# Patient Record
Sex: Female | Born: 1975
Health system: Southern US, Community
[De-identification: ages and names within clinical notes are randomized; demographics above are authoritative.]

## PROBLEM LIST (undated history)

## (undated) DIAGNOSIS — F419 Anxiety disorder, unspecified: Secondary | ICD-10-CM

## (undated) DIAGNOSIS — IMO0002 Reserved for concepts with insufficient information to code with codable children: Secondary | ICD-10-CM

## (undated) DIAGNOSIS — G43909 Migraine, unspecified, not intractable, without status migrainosus: Secondary | ICD-10-CM

## (undated) HISTORY — DX: Anxiety disorder, unspecified: F41.9

## (undated) HISTORY — PX: TONSILECTOMY/ADENOIDECTOMY WITH MYRINGOTOMY: SHX6125

## (undated) HISTORY — PX: BREAST REDUCTION SURGERY: SHX8

## (undated) HISTORY — DX: Reserved for concepts with insufficient information to code with codable children: IMO0002

## (undated) HISTORY — PX: MOUTH SURGERY: SHX715

## (undated) HISTORY — PX: BREAST SURGERY: SHX581

## (undated) HISTORY — PX: ABDOMINAL HYSTERECTOMY: SHX81

## (undated) HISTORY — DX: Migraine, unspecified, not intractable, without status migrainosus: G43.909

---

## 1999-08-20 ENCOUNTER — Other Ambulatory Visit: Admission: RE | Admit: 1999-08-20 | Discharge: 1999-08-20 | Payer: Self-pay | Admitting: Obstetrics and Gynecology

## 2002-01-26 ENCOUNTER — Encounter: Payer: Self-pay | Admitting: Obstetrics & Gynecology

## 2002-01-26 ENCOUNTER — Inpatient Hospital Stay (HOSPITAL_COMMUNITY): Admission: RE | Admit: 2002-01-26 | Discharge: 2002-01-28 | Payer: Self-pay | Admitting: Obstetrics & Gynecology

## 2006-12-13 ENCOUNTER — Emergency Department (HOSPITAL_COMMUNITY): Admission: EM | Admit: 2006-12-13 | Discharge: 2006-12-13 | Payer: Self-pay | Admitting: Emergency Medicine

## 2007-07-13 ENCOUNTER — Emergency Department (HOSPITAL_COMMUNITY): Admission: EM | Admit: 2007-07-13 | Discharge: 2007-07-13 | Payer: Self-pay | Admitting: Emergency Medicine

## 2007-07-19 ENCOUNTER — Emergency Department (HOSPITAL_COMMUNITY): Admission: EM | Admit: 2007-07-19 | Discharge: 2007-07-19 | Payer: Self-pay | Admitting: Emergency Medicine

## 2010-05-15 ENCOUNTER — Ambulatory Visit (HOSPITAL_COMMUNITY)
Admission: RE | Admit: 2010-05-15 | Discharge: 2010-05-15 | Disposition: A | Discharge status: Home / Self Care | Payer: PRIVATE HEALTH INSURANCE | Source: Ambulatory Visit | Attending: Internal Medicine | Admitting: Internal Medicine

## 2010-05-15 ENCOUNTER — Other Ambulatory Visit (HOSPITAL_COMMUNITY): Payer: Self-pay | Admitting: Internal Medicine

## 2010-05-15 DIAGNOSIS — R109 Unspecified abdominal pain: Secondary | ICD-10-CM | POA: Insufficient documentation

## 2010-08-21 NOTE — Op Note (Signed)
   NAME:  Tracey Mcneil, Tracey Mcneil                        ACCOUNT NO.:  [id number]   MEDICAL RECORD NO.:  [id number]                   PATIENT TYPE:  INP   LOCATION:  A426                                 FACILITY:  [facility]   PHYSICIAN:  Tilda Burrow, M.D.              DATE OF BIRTH:  [birth year]   DATE OF PROCEDURE:  03/27/2002  DATE OF DISCHARGE:  01/28/2002                                 OPERATIVE REPORT   PREOPERATIVE DIAGNOSIS:  Postoperative vaginal cuff bleeding.   POSTOPERATIVE DIAGNOSES:  1. Postoperative vaginal cuff bleeding.  2. Bladder flap hematoma.   PROCEDURE:  1. Revision of anterior repair.  2. Evacuation of anterior repair hematoma.   SURGEON:  Tilda Burrow, M.D.   ASSISTANTAmie Critchley.   ANESTHESIA:  General.   COMPLICATIONS:  None.   ESTIMATED BLOOD LOSS:  100 mL plus old clots.   DESCRIPTION OF PROCEDURE:  The patient was taken to the operating room after  consent was obtained and efforts at nonsurgical treatment of the hematoma  had failed. The patient had repeat anesthesia administered and then we  proceeded to open the anterior incision of the prior hysterectomy and  anterior repair closure.  There was a reasonable amount of clots in place,  less than 100 mL.  This was extracted and beneath it we found a small  arterial pumper, underneath the bladder flap.  This was identified and point  cautery used as necessary to achieve hemostasis and then a couple of extra  stitches with 2-0 Dexon placed to ensure adequate hemostasis.  Skin edges of  the vaginal mucosa were then reapproximated and vaginal packing positioned.  The patient went to the recovery room in stable condition.                                               Tilda Burrow, M.D.    JVF/MEDQ  D:  04/28/2002  T:  04/29/2002  Job:  [id number]

## 2010-08-21 NOTE — H&P (Signed)
NAME:  Tracey Mcneil, Tracey Mcneil                        ACCOUNT NO.:  [id number]   MEDICAL RECORD NO.:  [id number]                   PATIENT TYPE:  AMB   LOCATION:  DAY                                  FACILITY:  [facility]   PHYSICIAN:  Lazaro Arms, M.D.                DATE OF BIRTH:  [birth year]   DATE OF ADMISSION:  DATE OF DISCHARGE:                                HISTORY & PHYSICAL   HISTORY OF PRESENT ILLNESS:  The patient is a 35 year old white female,  gravida 4, para 3, abortus 1, with last menstrual period of January 10, 2002,  who presents complaining of prolonged heavy vaginal bleeding with bad  cramping with her menses.  She states that she bleeds normally about five to  seven days, but she has had worse clotting and the bleeding has been heavier  recently.  She had an IUD in place and was taking the birth control pills  for it, because she was having bleeding, but beyond that, she has a lot of  pressure symptoms, like something is falling out or something is pulling  out.  She denies any incontinence.  Intercourse is painful because it feels  like something is pushing on the cervix or the uterus.  She has no adnexal  or side pain.  On examination, she has grade 2 to 3 uterine prolapse, a  grade 2 cystocele and a grade 1 rectocele.  The adnexa are negative.  I  talked with the patient.  This problem runs in her family; she has a sister  with bad prolapse and her mother also and, in fact, her sister is  incontinent.  We discussed at this time she could delay surgery and we could  probably handle the bleeding with birth control pills, but the pain that she  is having with the pressure just with her job and everything is more than  she can handle, and she wants definitive therapy.  She is admitted for a TVH  and A&P repair.  She understands that I may not do a posterior repair,  depending on what the vagina is like after the anterior repair.  She also  understands that this probably will  not be a definitive surgery for her,  that some point in the future, she will probably need a sling __________  placed, but since she is so young, I do not want to get too aggressive at  this point, but we will do as good a suspension as we can of the vaginal  cuff.   PAST MEDICAL HISTORY:  Past medical history is significant for depression.   PAST SURGICAL HISTORY:  Breast reduction and tonsils.   PAST OBSTETRICAL HISTORY:  One miscarriage and three vaginal deliveries.   MEDICATIONS:  Wellbutrin 150 mg twice a day.   ALLERGIES:  SULFA.   REVIEW OF SYSTEMS:  Her review of systems is otherwise negative.   PHYSICAL  EXAMINATION:  VITAL SIGNS:  Her weight is 114 pounds.  Blood  pressure is 100/70.  HEENT:  Unremarkable.  NECK:  Thyroid is normal.  LUNGS:  Lungs are clear.  HEART:  Heart is regular rhythm without murmur, regurgitation or gallop.  BREASTS:  Deferred.  ABDOMEN:  Benign.  No hepatosplenomegaly or masses.  PELVIC:  She has normal external genitalia.  She has uterine prolapse, a  grade 2 cystocele and grade 1 rectocele.  The uterus is quite tender to  palpation and the adnexa are negative.  RECTAL:  Exam is benign.  EXTREMITIES:  Extremities are warm with no edema.  NEUROLOGIC:  Exam is grossly intact.   IMPRESSION:  1. Symptomatic pelvic relaxation.  2. Dysmenorrhea.  3. Menometrorrhagia.   PLAN:  The patient is admitted for [facility] and anterior colporrhaphy and  possibly posterior colporrhaphy.  She understands the rationale behind the  procedures that we will do or not do, based on my best estimation of sexual  function.  She also understands that in the future she will probably need  more aggressive therapy, but I want to try to avoid that at this time  because of her age and want to maintain good sexual function.                                               Lazaro Arms, M.D.    [name]  D:  01/24/2002  T:  01/25/2002  Job:  [id number]

## 2010-08-21 NOTE — Op Note (Signed)
NAME:  Tracey Mcneil, Tracey Mcneil                        ACCOUNT NO.:  [id number]   MEDICAL RECORD NO.:  [id number]                   PATIENT TYPE:  AMB   LOCATION:  DAY                                  FACILITY:  [facility]   PHYSICIAN:  Lazaro Arms, M.D.                DATE OF BIRTH:  10/17/1975   DATE OF PROCEDURE:  01/25/2002  DATE OF DISCHARGE:                                 OPERATIVE REPORT   PREOPERATIVE DIAGNOSES:  1. Symptomatic pelvic relaxation.  2. Dysmenorrhea.  3. Menometrorrhagia.   POSTOPERATIVE DIAGNOSES:  1. Symptomatic pelvic relaxation.  2. Dysmenorrhea.  3. Menometrorrhagia.   PROCEDURE:  A vaginal hysterectomy with anterior repair.   SURGEON:  Lazaro Arms, M.D.   ANESTHESIA:  General endotracheal.   FINDINGS:  The patient had a grade 2-3 uterine prolapse, grade 2 bladder  prolapse, and a grade 1 rectal prolapse.  Intraperitoneal structures were  normal.  Her ovaries and tubes were normal.  The lower pelvis was normal.   DESCRIPTION OF OPERATION:  The patient was taken to the operating room and  placed in the supine position where she underwent general endotracheal  anesthesia.  She was then placed in dorsal lithotomy position in candy-cane  stirrups.  The lower abdomen, inner thighs, vagina, and perianal region were  prepped with Betadine.  A weighted speculum was placed posteriorly and a  Deaver anteriorly.  The cervix was grasped with two thyroid tenaculums.  Marcaine 0.5% with 1:200,000 epinephrine was injected in a circumferential  fashion about the cervix to create a plane and also to minimize blood loss.  The electrocautery Bovie was then used to make a circumferential incision  about the cervix, and the anterior and posterior vagina were pushed off the  lower uterine segment.  The posterior cul-de-sac was entered sharply without  difficulty, and a long-bill weighted speculum was placed.  The uterosacral  ligament were clamped, cut, transfixed, and  suture ligated and held.  The  cardinal ligaments were clamped, cut, transfixed, and suture ligated and  cut.  The anterior cul-de-sac was entered sharply without difficulty.  The  anterior and posterior leaves of the broad ligament were plicated, and the  uterine vessels were clamped, cut, and suture ligated.  Serial pedicles were  taken through the fundus, each pedicle being clamped, cut, and suture  ligated.  The uteroovarian ligaments were cross-clamped; double suture  ligation was performed.  There was good hemostasis of these pedicles.  There  was some raw peritoneal surface on the right, and hemoclips were placed.  These sutures were cut free.  McCall culdoplasty suture was placed and held.  The peritoneum was closed in a pursestring fashion.  The vagina was closed  anterior to posterior in an interrupted fashion.  The McCall culdoplasty  suture was then tied down tightly for good vaginal support.  The anterior  vagina was then opened in the midline,  and the bladder was dissected off the  vagina sharply and bluntly.  A Kelly plication and anterior colporrhaphy was  then performed in the usual fashion with good hemostasis.  The extra vagina  was trimmed away, and the vagina was closed in interrupted running fashion.  At this point, I could easily get two fingers in the mid point of the vagina  crossways, but it did not feel like a posterior repair would be in her best  interest and as we had talked in the office preoperatively.  As a result,  the procedure was felt to be completed at this point.  She experienced 100  cc of blood loss.  She was awakened from anesthesia and taken to the  recovery room in good and stable condition.  All counts were correct x 3.                                               Lazaro Arms, M.D.    [name]  D:  01/25/2002  T:  01/25/2002  Job:  [id number]

## 2010-08-21 NOTE — Discharge Summary (Signed)
   NAME:  Tracey Mcneil, Tracey Mcneil                        ACCOUNT NO.:  [id number]   MEDICAL RECORD NO.:  [id number]                   PATIENT TYPE:  INP   LOCATION:  A426                                 FACILITY:  [facility]   PHYSICIAN:  Lazaro Arms, M.D.                DATE OF BIRTH:  1976-03-15   DATE OF ADMISSION:  01/25/2002  DATE OF DISCHARGE:  01/28/2002                                 DISCHARGE SUMMARY   DISCHARGE DIAGNOSES:  1. Status post total vaginal hysterectomy and anterior colporrhaphy.  2. Postoperative hemorrhage.   PROCEDURES:  1. Total vaginal hysterectomy and anterior colporrhaphy.  2. Reoperation due to vaginal bleeding that could not be controlled with     packing.  3. Vaginal packing.   HISTORY OF PRESENT ILLNESS:  Please refer to the transcribed history and  physical and operative note for details of admission to the hospital.   HOSPITAL COURSE:  The patient was admitted postoperatively.  She did quite  well.  Intraoperative blood loss was quite low.  However, several hours  postoperatively she developed heavy vaginal bleeding.  In my absence was  taken back to the operating room by Dr. Emelda Fear because he could not  control it with packing.  She was really found to have an active arterial  bleeder, and this was resutured, and the patient was packed again at this  time.  I removed the packing the next morning, and she began bleeding again.  Again, this was the anterior colporrhaphy portion of the surgery.  I  repacked her and left it for 24 hours and removed it once again, and she had  no bleeding.  She did not have any fever postoperatively.  Her hemoglobin  and hematocrit bottomed out at about 7 and 21, but she tolerated ambulation.  She tolerated clear liquids and a regular diet, and she voided without  symptoms.  She was discharged to home on the evening of postoperative day #2  in stable condition.  To follow up next week to have her hemoglobin checked.  She  was discharged on Levaquin as well.                                               Lazaro Arms, M.D.    [name]  D:  04/24/2002  T:  04/25/2002  Job:  [id number]

## 2010-12-29 LAB — URINALYSIS, ROUTINE W REFLEX MICROSCOPIC
Bilirubin Urine: NEGATIVE
Glucose, UA: 250 — AB
Nitrite: POSITIVE — AB
Protein, ur: 100 — AB
Specific Gravity, Urine: <1.005 — ABNORMAL LOW
Urobilinogen, UA: >8 — ABNORMAL HIGH
pH: 5.5

## 2010-12-29 LAB — URINE MICROSCOPIC-ADD ON

## 2011-01-15 LAB — URINE MICROSCOPIC-ADD ON

## 2011-01-15 LAB — DIFFERENTIAL
Basophils Absolute: 0
Basophils Relative: 0
Eosinophils Absolute: 0.1
Monocytes Absolute: 0.7
Monocytes Relative: 7

## 2011-01-15 LAB — URINALYSIS, ROUTINE W REFLEX MICROSCOPIC
Bilirubin Urine: NEGATIVE
Nitrite: NEGATIVE
Specific Gravity, Urine: 1.02
pH: 6

## 2011-01-15 LAB — BASIC METABOLIC PANEL
CO2: 26
Calcium: 8.8
Chloride: 100
Creatinine, Ser: 0.6
Glucose, Bld: 81

## 2011-01-15 LAB — CBC
Hemoglobin: 12.9
MCHC: 34.7
MCV: 85.6
RBC: 4.35
RDW: 11.6

## 2012-10-19 ENCOUNTER — Other Ambulatory Visit: Payer: Self-pay | Admitting: Obstetrics & Gynecology

## 2012-10-19 ENCOUNTER — Other Ambulatory Visit: Payer: Self-pay | Admitting: *Deleted

## 2012-10-19 MED ORDER — ALPRAZOLAM 1 MG PO TABS: 1.0000 mg | ORAL_TABLET | Freq: Three times a day (TID) | ORAL | Status: DC | PRN

## 2013-02-15 ENCOUNTER — Other Ambulatory Visit: Payer: Self-pay | Admitting: *Deleted

## 2013-02-15 MED ORDER — ALPRAZOLAM 1 MG PO TABS: 1.0000 mg | ORAL_TABLET | Freq: Every evening | ORAL | Status: DC | PRN

## 2013-04-16 ENCOUNTER — Other Ambulatory Visit: Payer: Self-pay | Admitting: *Deleted

## 2013-04-17 ENCOUNTER — Telehealth: Payer: Self-pay | Admitting: *Deleted

## 2013-04-17 MED ORDER — ALPRAZOLAM 1 MG PO TABS: 1.0000 mg | ORAL_TABLET | Freq: Three times a day (TID) | ORAL | Status: DC | PRN

## 2013-04-18 NOTE — Telephone Encounter (Signed)
Spoke with pharmacist at [facility]. Gave verbal order for Xanax 1 mg take 1 tab by mouth TID as needed for anxiety # 90 with 1 refill per Dr. Despina HiddenEure. Pharmacy never received Rx. JSY

## 2013-04-23 ENCOUNTER — Ambulatory Visit (INDEPENDENT_AMBULATORY_CARE_PROVIDER_SITE_OTHER): Payer: PRIVATE HEALTH INSURANCE

## 2013-04-23 ENCOUNTER — Other Ambulatory Visit: Payer: Self-pay | Admitting: Obstetrics & Gynecology

## 2013-04-23 ENCOUNTER — Encounter (INDEPENDENT_AMBULATORY_CARE_PROVIDER_SITE_OTHER): Payer: Self-pay

## 2013-04-23 ENCOUNTER — Ambulatory Visit (INDEPENDENT_AMBULATORY_CARE_PROVIDER_SITE_OTHER): Payer: PRIVATE HEALTH INSURANCE | Admitting: Obstetrics & Gynecology

## 2013-04-23 ENCOUNTER — Ambulatory Visit (HOSPITAL_COMMUNITY): Payer: Self-pay

## 2013-04-23 ENCOUNTER — Encounter: Payer: Self-pay | Admitting: Obstetrics & Gynecology

## 2013-04-23 VITALS — BP 98/60 | Ht 65.0 in | Wt 122.5 lb

## 2013-04-23 DIAGNOSIS — Z01419 Encounter for gynecological examination (general) (routine) without abnormal findings: Secondary | ICD-10-CM

## 2013-04-23 DIAGNOSIS — R160 Hepatomegaly, not elsewhere classified: Secondary | ICD-10-CM

## 2013-04-23 NOTE — Progress Notes (Signed)
Patient ID: Tracey RiegerClaudia Heenan, female   DOB: 04/19/1975, 38 y.o.   MRN: [id number] Subjective:     Tracey Mcneil is a 38 y.o. female here for a routine exam.  No LMP recorded. Patient has had a hysterectomy. No obstetric history on file. Birth Control Method:  Hysterectomy  Menstrual Calendar(currently): na  Current complaints: none.   Current acute medical issues:  none   Recent Gynecologic History No LMP recorded. Patient has had a hysterectomy. Last Pap: na ,  na Last mammogram: ,  na  Past Medical History  Diagnosis Date  . Anxiety   . Migraines   . Degenerative disk disease     Past Surgical History  Procedure Laterality Date  . Abdominal hysterectomy      bladder tach  . Breast surgery      breast implants  . Breast reduction surgery    . Tonsilectomy/adenoidectomy with myringotomy    . Mouth surgery      OB History   Grav Para Term Preterm Abortions TAB SAB Ect Mult Living                  History   Social History  . Marital Status: Married    Spouse Name: N/A    Number of Children: N/A  . Years of Education: N/A   Social History Main Topics  . Smoking status: Never Smoker   . Smokeless tobacco: Never Used  . Alcohol Use: No  . Drug Use: No  . Sexual Activity: Yes   Other Topics Concern  . None   Social History Narrative  . None    Family History  Problem Relation Age of Onset  . Thyroid disease Mother   . Diabetes Father   . Hyperlipidemia Father   . Diabetes Sister   . Heart disease Sister   . Thyroid disease Sister   . Heart disease Maternal Grandmother     CHF  . Diabetes Maternal Grandmother   . Heart disease Paternal Grandmother   . Alzheimer's disease Paternal Grandmother   . Thyroid disease Sister   . Thyroid disease Sister      Review of Systems  Review of Systems  Constitutional: Negative for fever, chills, weight loss, malaise/fatigue and diaphoresis.  HENT: Negative for hearing loss, ear pain, nosebleeds, congestion,  sore throat, neck pain, tinnitus and ear discharge.   Eyes: Negative for blurred vision, double vision, photophobia, pain, discharge and redness.  Respiratory: Negative for cough, hemoptysis, sputum production, shortness of breath, wheezing and stridor.   Cardiovascular: Negative for chest pain, palpitations, orthopnea, claudication, leg swelling and PND.  Gastrointestinal: negative for abdominal pain. Negative for heartburn, nausea, vomiting, diarrhea, constipation, blood in stool and melena.  Genitourinary: Negative for dysuria, urgency, frequency, hematuria and flank pain.  Musculoskeletal: Negative for myalgias, back pain, joint pain and falls.  Skin: Negative for itching and rash.  Neurological: Negative for dizziness, tingling, tremors, sensory change, speech change, focal weakness, seizures, loss of consciousness, weakness and headaches.  Endo/Heme/Allergies: Negative for environmental allergies and polydipsia. Does not bruise/bleed easily.  Psychiatric/Behavioral: Negative for depression, suicidal ideas, hallucinations, memory loss and substance abuse. The patient is not nervous/anxious and does not have insomnia.        Objective:    Physical Exam  Vitals reviewed. Constitutional: She is oriented to person, place, and time. She appears well-developed and well-nourished.  HENT:  Head: Normocephalic and atraumatic.        Right Ear: External ear normal.  Left  Ear: External ear normal.  Nose: Nose normal.  Mouth/Throat: Oropharynx is clear and moist.  Eyes: Conjunctivae and EOM are normal. Pupils are equal, round, and reactive to light. Right eye exhibits no discharge. Left eye exhibits no discharge. No scleral icterus.  Neck: Normal range of motion. Neck supple. No tracheal deviation present. No thyromegaly present.  Cardiovascular: Normal rate, regular rhythm, normal heart sounds and intact distal pulses.  Exam reveals no gallop and no friction rub.   No murmur  heard. Respiratory: Effort normal and breath sounds normal. No respiratory distress. She has no wheezes. She has no rales. She exhibits no tenderness.  GI: Soft. Bowel sounds are normal. She exhibits no distension and no mass. There is no tenderness. There is no rebound and no guarding.  Genitourinary:  Breasts no masses skin changes or nipple changes bilaterally      Vulva is normal without lesions Vagina is pink moist without discharge Cervix absent Uterus absent Adnexa is negative with normal sized ovaries    Musculoskeletal: Normal range of motion. She exhibits no edema and no tenderness.  Neurological: She is alert and oriented to person, place, and time. She has normal reflexes. She displays normal reflexes. No cranial nerve deficit. She exhibits normal muscle tone. Coordination normal.  Skin: Skin is warm and dry. No rash noted. No erythema. No pallor.  Psychiatric: She has a normal mood and affect. Her behavior is normal. Judgment and thought content normal.       Assessment:    Healthy female exam.    Plan:    Follow up in: 1 year.

## 2013-06-11 ENCOUNTER — Other Ambulatory Visit: Payer: Self-pay | Admitting: *Deleted

## 2013-06-11 MED ORDER — ALPRAZOLAM 1 MG PO TABS: 1.0000 mg | ORAL_TABLET | Freq: Three times a day (TID) | ORAL | Status: DC | PRN

## 2013-06-13 ENCOUNTER — Telehealth: Payer: Self-pay | Admitting: Obstetrics & Gynecology

## 2013-06-13 NOTE — Telephone Encounter (Signed)
Pt requesting a 90 day supply of Xanax verses 30 day supply with 5 refills. Fax given to Dr. Despina HiddenEure to clarify order.

## 2013-06-14 ENCOUNTER — Telehealth: Payer: Self-pay | Admitting: Obstetrics & Gynecology

## 2013-06-14 MED ORDER — ALPRAZOLAM 1 MG PO TABS: 1.0000 mg | ORAL_TABLET | Freq: Three times a day (TID) | ORAL | Status: DC | PRN

## 2013-06-14 NOTE — Telephone Encounter (Signed)
Spoke with pt letting her know new Rx was sent to pharmacy for Xanax. JSY

## 2013-08-16 ENCOUNTER — Other Ambulatory Visit: Payer: Self-pay | Admitting: Obstetrics & Gynecology

## 2013-10-15 ENCOUNTER — Other Ambulatory Visit: Payer: Self-pay | Admitting: Obstetrics & Gynecology

## 2013-12-13 ENCOUNTER — Other Ambulatory Visit: Payer: Self-pay | Admitting: Obstetrics & Gynecology

## 2014-02-08 ENCOUNTER — Other Ambulatory Visit: Payer: Self-pay | Admitting: Obstetrics & Gynecology

## 2014-05-17 ENCOUNTER — Ambulatory Visit (INDEPENDENT_AMBULATORY_CARE_PROVIDER_SITE_OTHER): Payer: BLUE CROSS/BLUE SHIELD | Admitting: Obstetrics & Gynecology

## 2014-05-17 ENCOUNTER — Encounter: Payer: Self-pay | Admitting: Obstetrics & Gynecology

## 2014-05-17 VITALS — BP 120/80 | Ht 65.0 in | Wt 123.0 lb

## 2014-05-17 DIAGNOSIS — Z01411 Encounter for gynecological examination (general) (routine) with abnormal findings: Secondary | ICD-10-CM

## 2014-05-17 DIAGNOSIS — Z01419 Encounter for gynecological examination (general) (routine) without abnormal findings: Secondary | ICD-10-CM

## 2014-05-17 MED ORDER — TERCONAZOLE 0.4 % VA CREA: 1.0000 | TOPICAL_CREAM | Freq: Every day | VAGINAL | Status: DC

## 2014-05-17 NOTE — Addendum Note (Signed)
Addended by: Lazaro ArmsEURE, LUTHER H on: 05/17/2014 09:46 AM   Modules accepted: Orders, Medications

## 2014-05-17 NOTE — Progress Notes (Signed)
Patient ID: Tracey RiegerClaudia Geter, female   DOB: 07/20/1975, 39 y.o.   MRN: [id number] Subjective:     Tracey Mcneil is a 39 y.o. female here for a routine exam.  No LMP recorded. Patient has had a hysterectomy. No obstetric history on file. Birth Control Method:  na Menstrual Calendar(currently): na  Current complaints: none.   Current acute medical issues:  none   Recent Gynecologic History No LMP recorded. Patient has had a hysterectomy. Last Pap: years ago,  normal Last mammogram: ,    Past Medical History  Diagnosis Date  . Anxiety   . Migraines   . Degenerative disk disease     Past Surgical History  Procedure Laterality Date  . Abdominal hysterectomy      bladder tach  . Breast surgery      breast implants  . Breast reduction surgery    . Tonsilectomy/adenoidectomy with myringotomy    . Mouth surgery      OB History    No data available      History   Social History  . Marital Status: Married    Spouse Name: N/A  . Number of Children: N/A  . Years of Education: N/A   Social History Main Topics  . Smoking status: Never Smoker   . Smokeless tobacco: Never Used  . Alcohol Use: No  . Drug Use: No  . Sexual Activity: Yes   Other Topics Concern  . None   Social History Narrative    Family History  Problem Relation Age of Onset  . Thyroid disease Mother   . Diabetes Father   . Hyperlipidemia Father   . Diabetes Sister   . Heart disease Sister   . Thyroid disease Sister   . Heart disease Maternal Grandmother     CHF  . Diabetes Maternal Grandmother   . Heart disease Paternal Grandmother   . Alzheimer's disease Paternal Grandmother   . Thyroid disease Sister   . Thyroid disease Sister      Current outpatient prescriptions:  .  ALPRAZolam (XANAX) 1 MG tablet, Take 1 tablet (1 mg total) by mouth 3 (three) times daily as needed for anxiety., Disp: 90 tablet, Rfl: 1 .  topiramate (TOPAMAX) 50 MG tablet, Take 50 mg by mouth 2 (two) times daily. , Disp:  , Rfl:  .  ALPRAZolam (XANAX) 1 MG tablet, Take 1 tablet (1 mg total) by mouth 3 (three) times daily as needed for anxiety. (Patient not taking: Reported on 05/17/2014), Disp: 90 tablet, Rfl: 3 .  ALPRAZolam (XANAX) 1 MG tablet, TAKE ONE TABLET BY MOUTH THREE TIMES A DAY AS NEEDED FOR ANXIETY (Patient not taking: Reported on 05/17/2014), Disp: 90 tablet, Rfl: 1 .  ALPRAZolam (XANAX) 1 MG tablet, TAKE ONE TABLET BY MOUTH THREE TIMES A DAY AS NEEDED FOR ANXIETY (Patient not taking: Reported on 05/17/2014), Disp: 90 tablet, Rfl: 1 .  ALPRAZolam (XANAX) 1 MG tablet, TAKE 1 TABLET BY MOUTH THREE TIMES DAILY AS NEEDED FOR ANXIETY (Patient not taking: Reported on 05/17/2014), Disp: 90 tablet, Rfl: 3  Review of Systems  Review of Systems  Constitutional: Negative for fever, chills, weight loss, malaise/fatigue and diaphoresis.  HENT: Negative for hearing loss, ear pain, nosebleeds, congestion, sore throat, neck pain, tinnitus and ear discharge.   Eyes: Negative for blurred vision, double vision, photophobia, pain, discharge and redness.  Respiratory: Negative for cough, hemoptysis, sputum production, shortness of breath, wheezing and stridor.   Cardiovascular: Negative for chest pain, palpitations, orthopnea, claudication,  leg swelling and PND.  Gastrointestinal: negative for abdominal pain. Negative for heartburn, nausea, vomiting, diarrhea, constipation, blood in stool and melena.  Genitourinary: Negative for dysuria, urgency, frequency, hematuria and flank pain.  Musculoskeletal: Negative for myalgias, back pain, joint pain and falls.  Skin: Negative for itching and rash.  Neurological: Negative for dizziness, tingling, tremors, sensory change, speech change, focal weakness, seizures, loss of consciousness, weakness and headaches.  Endo/Heme/Allergies: Negative for environmental allergies and polydipsia. Does not bruise/bleed easily.  Psychiatric/Behavioral: Negative for depression, suicidal ideas,  hallucinations, memory loss and substance abuse. The patient is not nervous/anxious and does not have insomnia.        Objective:  Blood pressure 120/80, height  (1.651 m), weight 123 lb (55.792 kg).   Physical Exam  Vitals reviewed. Constitutional: She is oriented to person, place, and time. She appears well-developed and well-nourished.  HENT:  Head: Normocephalic and atraumatic.        Right Ear: External ear normal.  Left Ear: External ear normal.  Nose: Nose normal.  Mouth/Throat: Oropharynx is clear and moist.  Eyes: Conjunctivae and EOM are normal. Pupils are equal, round, and reactive to light. Right eye exhibits no discharge. Left eye exhibits no discharge. No scleral icterus.  Neck: Normal range of motion. Neck supple. No tracheal deviation present. No thyromegaly present.  Cardiovascular: Normal rate, regular rhythm, normal heart sounds and intact distal pulses.  Exam reveals no gallop and no friction rub.   No murmur heard. Respiratory: Effort normal and breath sounds normal. No respiratory distress. She has no wheezes. She has no rales. She exhibits no tenderness.  GI: Soft. Bowel sounds are normal. She exhibits no distension and no mass. There is no tenderness. There is no rebound and no guarding.  Genitourinary:  Breasts no masses skin changes or nipple changes bilaterally      Vulva is normal without lesions Vagina is pink moist with yeast evident Cervix absent Uterus is normal size shape and contour Adnexa is negative with normal sized ovaries   Musculoskeletal: Normal range of motion. She exhibits no edema and no tenderness.  Neurological: She is alert and oriented to person, place, and time. She has normal reflexes. She displays normal reflexes. No cranial nerve deficit. She exhibits normal muscle tone. Coordination normal.  Skin: Skin is warm and dry. No rash noted. No erythema. No pallor.  Psychiatric: She has a normal mood and affect. Her behavior is normal.  Judgment and thought content normal.       Assessment:    Healthy female exam.    Plan:    Mammogram ordered. Follow up in: 1 year. Terazol 7

## 2014-05-30 ENCOUNTER — Other Ambulatory Visit: Payer: Self-pay

## 2014-05-30 DIAGNOSIS — Z1231 Encounter for screening mammogram for malignant neoplasm of breast: Secondary | ICD-10-CM

## 2014-06-03 ENCOUNTER — Telehealth: Payer: Self-pay | Admitting: *Deleted

## 2014-06-03 ENCOUNTER — Other Ambulatory Visit: Payer: Self-pay | Admitting: Obstetrics & Gynecology

## 2014-06-03 NOTE — Telephone Encounter (Signed)
Pt requesting refill on the Xanax 1 mg.

## 2014-06-04 NOTE — Telephone Encounter (Signed)
done

## 2014-06-14 ENCOUNTER — Ambulatory Visit
Admission: RE | Admit: 2014-06-14 | Discharge: 2014-06-14 | Disposition: A | Discharge status: Home / Self Care | Payer: BLUE CROSS/BLUE SHIELD | Source: Ambulatory Visit

## 2014-06-14 DIAGNOSIS — Z1231 Encounter for screening mammogram for malignant neoplasm of breast: Secondary | ICD-10-CM

## 2014-06-17 ENCOUNTER — Other Ambulatory Visit: Payer: Self-pay | Admitting: Obstetrics & Gynecology

## 2014-06-17 DIAGNOSIS — R928 Other abnormal and inconclusive findings on diagnostic imaging of breast: Secondary | ICD-10-CM

## 2014-06-27 ENCOUNTER — Ambulatory Visit
Admission: RE | Admit: 2014-06-27 | Discharge: 2014-06-27 | Disposition: A | Discharge status: Home / Self Care | Payer: BLUE CROSS/BLUE SHIELD | Source: Ambulatory Visit | Attending: Obstetrics & Gynecology | Admitting: Obstetrics & Gynecology

## 2014-06-27 DIAGNOSIS — R928 Other abnormal and inconclusive findings on diagnostic imaging of breast: Secondary | ICD-10-CM

## 2014-08-02 ENCOUNTER — Other Ambulatory Visit (HOSPITAL_COMMUNITY): Payer: Self-pay | Admitting: Physician Assistant

## 2014-08-02 ENCOUNTER — Ambulatory Visit (HOSPITAL_COMMUNITY)
Admission: RE | Admit: 2014-08-02 | Discharge: 2014-08-02 | Disposition: A | Discharge status: Home / Self Care | Payer: BLUE CROSS/BLUE SHIELD | Source: Ambulatory Visit | Attending: Physician Assistant | Admitting: Physician Assistant

## 2014-08-02 DIAGNOSIS — M25532 Pain in left wrist: Secondary | ICD-10-CM | POA: Diagnosis present

## 2014-08-02 DIAGNOSIS — Y939 Activity, unspecified: Secondary | ICD-10-CM | POA: Diagnosis not present

## 2014-08-02 DIAGNOSIS — Y929 Unspecified place or not applicable: Secondary | ICD-10-CM | POA: Diagnosis not present

## 2014-08-02 DIAGNOSIS — S6992XA Unspecified injury of left wrist, hand and finger(s), initial encounter: Secondary | ICD-10-CM | POA: Diagnosis not present

## 2014-08-02 DIAGNOSIS — G894 Chronic pain syndrome: Secondary | ICD-10-CM

## 2014-11-19 ENCOUNTER — Other Ambulatory Visit: Payer: Self-pay | Admitting: Obstetrics & Gynecology

## 2014-11-21 ENCOUNTER — Telehealth: Payer: Self-pay | Admitting: *Deleted

## 2014-11-21 NOTE — Telephone Encounter (Signed)
Xanax faxed to Pharmacy.

## 2015-05-09 ENCOUNTER — Other Ambulatory Visit: Payer: Self-pay | Admitting: Obstetrics & Gynecology

## 2015-05-12 ENCOUNTER — Telehealth: Payer: Self-pay | Admitting: Obstetrics & Gynecology

## 2015-05-12 NOTE — Telephone Encounter (Signed)
Xanax RX completed and faxed to pharmacy

## 2015-05-30 ENCOUNTER — Other Ambulatory Visit: Payer: BLUE CROSS/BLUE SHIELD | Admitting: Obstetrics & Gynecology

## 2015-06-06 ENCOUNTER — Ambulatory Visit (INDEPENDENT_AMBULATORY_CARE_PROVIDER_SITE_OTHER): Payer: Self-pay | Admitting: Obstetrics & Gynecology

## 2015-06-06 ENCOUNTER — Encounter: Payer: Self-pay | Admitting: Obstetrics & Gynecology

## 2015-06-06 VITALS — BP 100/60 | HR 74 | Ht 65.0 in | Wt 144.0 lb

## 2015-06-06 DIAGNOSIS — Z01419 Encounter for gynecological examination (general) (routine) without abnormal findings: Secondary | ICD-10-CM

## 2015-06-06 NOTE — Progress Notes (Signed)
Patient ID: Tracey Mcneil, female   DOB: 1975-06-06, 40 y.o.   MRN: [id number] Subjective:     Tracey Mcneil is a 40 y.o. female here for a routine exam.  No LMP recorded. Patient has had a hysterectomy. No obstetric history on file. Birth Control Method:  hysterectomy Menstrual Calendar(currently): amenorrheic  Current complaints: increasing nocturia 1-2 times nightly.   Current acute medical issues:  none   Recent Gynecologic History No LMP recorded. Patient has had a hysterectomy. Last Pap: na,   Last mammogram: 2016,  normal  Past Medical History  Diagnosis Date  . Anxiety   . Migraines   . Degenerative disk disease     Past Surgical History  Procedure Laterality Date  . Abdominal hysterectomy      bladder tach  . Breast surgery      breast implants  . Breast reduction surgery    . Tonsilectomy/adenoidectomy with myringotomy    . Mouth surgery      OB History    No data available      Social History   Social History  . Marital Status: Married    Spouse Name: N/A  . Number of Children: N/A  . Years of Education: N/A   Social History Main Topics  . Smoking status: Never Smoker   . Smokeless tobacco: Never Used  . Alcohol Use: No  . Drug Use: No  . Sexual Activity: Yes   Other Topics Concern  . None   Social History Narrative    Family History  Problem Relation Age of Onset  . Thyroid disease Mother   . Diabetes Father   . Hyperlipidemia Father   . Diabetes Sister   . Heart disease Sister   . Thyroid disease Sister   . Heart disease Maternal Grandmother     CHF  . Diabetes Maternal Grandmother   . Heart disease Paternal Grandmother   . Alzheimer's disease Paternal Grandmother   . Thyroid disease Sister   . Thyroid disease Sister      Current outpatient prescriptions:  .  ALPRAZolam (XANAX) 1 MG tablet, TAKE 1 TABLET THREE TIMES A DAY AS NEEDED, Disp: 90 tablet, Rfl: 5  Review of Systems  Review of Systems  Constitutional: Negative  for fever, chills, weight loss, malaise/fatigue and diaphoresis.  HENT: Negative for hearing loss, ear pain, nosebleeds, congestion, sore throat, neck pain, tinnitus and ear discharge.   Eyes: Negative for blurred vision, double vision, photophobia, pain, discharge and redness.  Respiratory: Negative for cough, hemoptysis, sputum production, shortness of breath, wheezing and stridor.   Cardiovascular: Negative for chest pain, palpitations, orthopnea, claudication, leg swelling and PND.  Gastrointestinal: negative for abdominal pain. Negative for heartburn, nausea, vomiting, diarrhea, constipation, blood in stool and melena.  Genitourinary: Negative for dysuria, urgency, frequency, hematuria and flank pain.  Musculoskeletal: Negative for myalgias, back pain, joint pain and falls.  Skin: Negative for itching and rash.  Neurological: Negative for dizziness, tingling, tremors, sensory change, speech change, focal weakness, seizures, loss of consciousness, weakness and headaches.  Endo/Heme/Allergies: Negative for environmental allergies and polydipsia. Does not bruise/bleed easily.  Psychiatric/Behavioral: Negative for depression, suicidal ideas, hallucinations, memory loss and substance abuse. The patient is not nervous/anxious and does not have insomnia.        Objective:  Blood pressure 100/60, pulse 74, height  (1.651 m), weight 144 lb (65.318 kg).   Physical Exam  Vitals reviewed. Constitutional: She is oriented to person, place, and time. She appears well-developed and  well-nourished.  HENT:  Head: Normocephalic and atraumatic.        Right Ear: External ear normal.  Left Ear: External ear normal.  Nose: Nose normal.  Mouth/Throat: Oropharynx is clear and moist.  Eyes: Conjunctivae and EOM are normal. Pupils are equal, round, and reactive to light. Right eye exhibits no discharge. Left eye exhibits no discharge. No scleral icterus.  Neck: Normal range of motion. Neck supple. No  tracheal deviation present. No thyromegaly present.  Cardiovascular: Normal rate, regular rhythm, normal heart sounds and intact distal pulses.  Exam reveals no gallop and no friction rub.   No murmur heard. Respiratory: Effort normal and breath sounds normal. No respiratory distress. She has no wheezes. She has no rales. She exhibits no tenderness.  GI: Soft. Bowel sounds are normal. She exhibits no distension and no mass. There is no tenderness. There is no rebound and no guarding.  Genitourinary:  Breasts no masses skin changes or nipple changes bilaterally      Vulva is normal without lesions Vagina is pink moist without discharge Cervix absent Uterus is absent Adnexa is negative with normal sized ovaries   Musculoskeletal: Normal range of motion. She exhibits no edema and no tenderness.  Neurological: She is alert and oriented to person, place, and time. She has normal reflexes. She displays normal reflexes. No cranial nerve deficit. She exhibits normal muscle tone. Coordination normal.  Skin: Skin is warm and dry. No rash noted. No erythema. No pallor.  Psychiatric: She has a normal mood and affect. Her behavior is normal. Judgment and thought content normal.       Assessment:    Healthy female exam.    Plan:    Follow up in: 1 year. refilled xanax recently    No orders of the defined types were placed in this encounter.    No orders of the defined types were placed in this encounter.

## 2015-10-27 ENCOUNTER — Other Ambulatory Visit: Payer: Self-pay | Admitting: Obstetrics & Gynecology

## 2016-02-17 ENCOUNTER — Other Ambulatory Visit: Payer: Self-pay | Admitting: Obstetrics & Gynecology

## 2016-02-19 ENCOUNTER — Other Ambulatory Visit: Payer: Self-pay | Admitting: Obstetrics & Gynecology

## 2016-04-15 ENCOUNTER — Other Ambulatory Visit: Payer: Self-pay | Admitting: Obstetrics & Gynecology

## 2016-06-11 ENCOUNTER — Other Ambulatory Visit: Payer: Self-pay | Admitting: Obstetrics & Gynecology

## 2016-06-25 ENCOUNTER — Encounter: Payer: Self-pay | Admitting: Obstetrics & Gynecology

## 2016-06-25 ENCOUNTER — Ambulatory Visit (INDEPENDENT_AMBULATORY_CARE_PROVIDER_SITE_OTHER): Payer: Self-pay | Admitting: Obstetrics & Gynecology

## 2016-06-25 VITALS — BP 118/72 | HR 97 | Ht 64.0 in | Wt 140.0 lb

## 2016-06-25 DIAGNOSIS — Z01419 Encounter for gynecological examination (general) (routine) without abnormal findings: Secondary | ICD-10-CM

## 2016-06-25 MED ORDER — CLINDAMYCIN PHOSPHATE 1 % EX GEL: Freq: Two times a day (BID) | CUTANEOUS | 11 refills | Status: DC

## 2016-06-25 NOTE — Progress Notes (Signed)
Subjective:     Tracey Mcneil is a 41 y.o. female here for a routine exam.  No LMP recorded. Patient has had a hysterectomy. G3P3 Birth Control Method:  Hysterectomy Menstrual Calendar(currently): amenorrheic  Current complaints: none.   Current acute medical issues:  A few episodes of hot flushes   Recent Gynecologic History No LMP recorded. Patient has had a hysterectomy. Last Pap: ,   Last mammogram: 2017,  normal  Past Medical History:  Diagnosis Date  . Anxiety   . Degenerative disk disease   . Migraines     Past Surgical History:  Procedure Laterality Date  . ABDOMINAL HYSTERECTOMY     bladder tach  . BREAST REDUCTION SURGERY    . BREAST SURGERY     breast implants  . MOUTH SURGERY    . TONSILECTOMY/ADENOIDECTOMY WITH MYRINGOTOMY      OB History    Gravida Para Term Preterm AB Living   3         3   SAB TAB Ectopic Multiple Live Births                  Social History   Social History  . Marital status: Married    Spouse name: N/A  . Number of children: N/A  . Years of education: N/A   Social History Main Topics  . Smoking status: Never Smoker  . Smokeless tobacco: Never Used  . Alcohol use No  . Drug use: No  . Sexual activity: Yes    Birth control/ protection: Surgical   Other Topics Concern  . None   Social History Narrative  . None    Family History  Problem Relation Age of Onset  . Thyroid disease Mother   . Diabetes Father   . Hyperlipidemia Father   . ALS Father   . Diabetes Sister   . Heart disease Sister   . Thyroid disease Sister   . Heart disease Maternal Grandmother     CHF  . Diabetes Maternal Grandmother   . Heart disease Paternal Grandmother   . Alzheimer's disease Paternal Grandmother   . Heart failure Paternal Grandmother   . Thyroid disease Sister   . Thyroid disease Sister      Current Outpatient Prescriptions:  .  ALPRAZolam (XANAX) 1 MG tablet, TAKE 1 TABLET BY MOUTH 3 TIMES A DAY AS NEEDED, Disp: 90  tablet, Rfl: 3 .  clindamycin (CLINDAGEL) 1 % gel, Apply topically 2 (two) times daily., Disp: 30 g, Rfl: 11  Review of Systems  Review of Systems  Constitutional: Negative for fever, chills, weight loss, malaise/fatigue and diaphoresis.  HENT: Negative for hearing loss, ear pain, nosebleeds, congestion, sore throat, neck pain, tinnitus and ear discharge.   Eyes: Negative for blurred vision, double vision, photophobia, pain, discharge and redness.  Respiratory: Negative for cough, hemoptysis, sputum production, shortness of breath, wheezing and stridor.   Cardiovascular: Negative for chest pain, palpitations, orthopnea, claudication, leg swelling and PND.  Gastrointestinal: negative for abdominal pain. Negative for heartburn, nausea, vomiting, diarrhea, constipation, blood in stool and melena.  Genitourinary: Negative for dysuria, urgency, frequency, hematuria and flank pain.  Musculoskeletal: Negative for myalgias, back pain, joint pain and falls.  Skin: Negative for itching and rash.  Neurological: Negative for dizziness, tingling, tremors, sensory change, speech change, focal weakness, seizures, loss of consciousness, weakness and headaches.  Endo/Heme/Allergies: Negative for environmental allergies and polydipsia. Does not bruise/bleed easily.  Psychiatric/Behavioral: Negative for depression, suicidal ideas, hallucinations, memory loss and  substance abuse. The patient is not nervous/anxious and does not have insomnia.        Objective:  Blood pressure 118/72, pulse 97, height 5\' 4"  (1.626 m), weight 140 lb (63.5 kg).   Physical Exam  Vitals reviewed. Constitutional: She is oriented to person, place, and time. She appears well-developed and well-nourished.  HENT:  Head: Normocephalic and atraumatic.        Right Ear: External ear normal.  Left Ear: External ear normal.  Nose: Nose normal.  Mouth/Throat: Oropharynx is clear and moist.  Eyes: Conjunctivae and EOM are normal. Pupils  are equal, round, and reactive to light. Right eye exhibits no discharge. Left eye exhibits no discharge. No scleral icterus.  Neck: Normal range of motion. Neck supple. No tracheal deviation present. No thyromegaly present.  Cardiovascular: Normal rate, regular rhythm, normal heart sounds and intact distal pulses.  Exam reveals no gallop and no friction rub.   No murmur heard. Respiratory: Effort normal and breath sounds normal. No respiratory distress. She has no wheezes. She has no rales. She exhibits no tenderness.  GI: Soft. Bowel sounds are normal. She exhibits no distension and no mass. There is no tenderness. There is no rebound and no guarding.  Genitourinary:  Breasts no masses skin changes or nipple changes bilaterally      Vulva is normal without lesions Vagina is pink moist without discharge Cervix absent Uterus is absent Adnexa is negative with normal sized ovaries   Musculoskeletal: Normal range of motion. She exhibits no edema and no tenderness.  Neurological: She is alert and oriented to person, place, and time. She has normal reflexes. She displays normal reflexes. No cranial nerve deficit. She exhibits normal muscle tone. Coordination normal.  Skin: Skin is warm and dry. No rash noted. No erythema. No pallor.  Psychiatric: She has a normal mood and affect. Her behavior is normal. Judgment and thought content normal.       Medications Ordered at today's visit: Meds ordered this encounter  Medications  . clindamycin (CLINDAGEL) 1 % gel    Sig: Apply topically 2 (two) times daily.    Dispense:  30 g    Refill:  11    Other orders placed at today's visit: No orders of the defined types were placed in this encounter.     Assessment:    Healthy female exam.   s/p TVH anterior repair 15 years ago Plan:    Mammogram ordered. Follow up in: 1 year.     Return in about 1 year (around 06/25/2017) for yearly, with Dr Despina HiddenEure.

## 2016-08-04 ENCOUNTER — Other Ambulatory Visit: Payer: Self-pay | Admitting: Obstetrics & Gynecology

## 2016-08-06 ENCOUNTER — Other Ambulatory Visit: Payer: Self-pay | Admitting: Obstetrics & Gynecology

## 2016-08-06 MED ORDER — ALPRAZOLAM 1 MG PO TABS: 1.0000 mg | ORAL_TABLET | Freq: Three times a day (TID) | ORAL | 3 refills | Status: DC | PRN

## 2016-08-06 NOTE — Telephone Encounter (Signed)
LMOVM that prescription was sent to pharmacy.  

## 2016-08-06 NOTE — Telephone Encounter (Signed)
Pt called and left a message on our answering machine that she has called her pharmacy and asked for a medication refill and pt would like to know if Dr. Despina HiddenEure has already refaxed the order. Please contact pt

## 2016-11-27 ENCOUNTER — Other Ambulatory Visit: Payer: Self-pay | Admitting: Obstetrics & Gynecology

## 2017-04-18 ENCOUNTER — Other Ambulatory Visit: Payer: Self-pay | Admitting: Obstetrics & Gynecology

## 2017-08-10 ENCOUNTER — Other Ambulatory Visit: Payer: Self-pay | Admitting: Obstetrics & Gynecology

## 2017-08-23 ENCOUNTER — Other Ambulatory Visit: Payer: Self-pay | Admitting: Obstetrics & Gynecology

## 2017-09-19 ENCOUNTER — Other Ambulatory Visit: Payer: Self-pay

## 2017-09-19 ENCOUNTER — Ambulatory Visit (INDEPENDENT_AMBULATORY_CARE_PROVIDER_SITE_OTHER): Payer: Self-pay | Admitting: Obstetrics & Gynecology

## 2017-09-19 ENCOUNTER — Encounter: Payer: Self-pay | Admitting: Obstetrics & Gynecology

## 2017-09-19 VITALS — BP 134/70 | HR 83 | Ht 66.0 in | Wt 184.0 lb

## 2017-09-19 DIAGNOSIS — Z01411 Encounter for gynecological examination (general) (routine) with abnormal findings: Secondary | ICD-10-CM

## 2017-09-19 DIAGNOSIS — Z79890 Hormone replacement therapy (postmenopausal): Secondary | ICD-10-CM

## 2017-09-19 DIAGNOSIS — Z01419 Encounter for gynecological examination (general) (routine) without abnormal findings: Secondary | ICD-10-CM

## 2017-09-19 MED ORDER — ESTRADIOL 1 MG PO TABS: 1.0000 mg | ORAL_TABLET | Freq: Every day | ORAL | 11 refills | Status: DC

## 2017-09-19 NOTE — Progress Notes (Signed)
Subjective:     Tracey Mcneil is a 42 y.o. female here for a routine exam.  No LMP recorded. Patient has had a hysterectomy. G3P0 Birth Control Method:  hysterectomy Menstrual Calendar(currently): amenorrheic  Current complaints: daily hot flushes.   Current acute medical issues:  none   Recent Gynecologic History No LMP recorded. Patient has had a hysterectomy. Last Pap: n/a   Last mammogram: 2016,  normal  Past Medical History:  Diagnosis Date  . Anxiety   . Degenerative disk disease   . Migraines     Past Surgical History:  Procedure Laterality Date  . ABDOMINAL HYSTERECTOMY     bladder tach  . BREAST REDUCTION SURGERY    . BREAST SURGERY     breast implants  . MOUTH SURGERY    . TONSILECTOMY/ADENOIDECTOMY WITH MYRINGOTOMY      OB History    Gravida  3   Para      Term      Preterm      AB      Living  3     SAB      TAB      Ectopic      Multiple      Live Births              Social History   Socioeconomic History  . Marital status: Married    Spouse name: Not on file  . Number of children: Not on file  . Years of education: Not on file  . Highest education level: Not on file  Occupational History  . Not on file  Social Needs  . Financial resource strain: Not on file  . Food insecurity:    Worry: Not on file    Inability: Not on file  . Transportation needs:    Medical: Not on file    Non-medical: Not on file  Tobacco Use  . Smoking status: Never Smoker  . Smokeless tobacco: Never Used  Substance and Sexual Activity  . Alcohol use: No  . Drug use: No  . Sexual activity: Yes    Birth control/protection: Surgical  Lifestyle  . Physical activity:    Days per week: Not on file    Minutes per session: Not on file  . Stress: Not on file  Relationships  . Social connections:    Talks on phone: Not on file    Gets together: Not on file    Attends religious service: Not on file    Active member of club or organization: Not  on file    Attends meetings of clubs or organizations: Not on file    Relationship status: Not on file  Other Topics Concern  . Not on file  Social History Narrative  . Not on file    Family History  Problem Relation Age of Onset  . Thyroid disease Mother   . Diabetes Father   . Hyperlipidemia Father   . ALS Father   . Diabetes Sister   . Heart disease Sister   . Thyroid disease Sister   . Heart disease Maternal Grandmother        CHF  . Diabetes Maternal Grandmother   . Heart disease Paternal Grandmother   . Alzheimer's disease Paternal Grandmother   . Heart failure Paternal Grandmother   . Thyroid disease Sister   . Thyroid disease Sister      Current Outpatient Medications:  .  ALPRAZolam (XANAX) 1 MG tablet, TAKE 1 TABLET BY MOUTH  THREE TIMES A DAY AS NEEDED, Disp: 90 tablet, Rfl: 5 .  estradiol (ESTRACE) 1 MG tablet, Take 1 tablet (1 mg total) by mouth daily., Disp: 30 tablet, Rfl: 11  Review of Systems  Review of Systems  Constitutional: Negative for fever, chills, weight loss, malaise/fatigue and diaphoresis.  HENT: Negative for hearing loss, ear pain, nosebleeds, congestion, sore throat, neck pain, tinnitus and ear discharge.   Eyes: Negative for blurred vision, double vision, photophobia, pain, discharge and redness.  Respiratory: Negative for cough, hemoptysis, sputum production, shortness of breath, wheezing and stridor.   Cardiovascular: Negative for chest pain, palpitations, orthopnea, claudication, leg swelling and PND.  Gastrointestinal: negative for abdominal pain. Negative for heartburn, nausea, vomiting, diarrhea, constipation, blood in stool and melena.  Genitourinary: Negative for dysuria, urgency, frequency, hematuria and flank pain.  Musculoskeletal: Negative for myalgias, back pain, joint pain and falls.  Skin: Negative for itching and rash.  Neurological: Negative for dizziness, tingling, tremors, sensory change, speech change, focal weakness,  seizures, loss of consciousness, weakness and headaches.  Endo/Heme/Allergies: Negative for environmental allergies and polydipsia. Does not bruise/bleed easily.  Psychiatric/Behavioral: Negative for depression, suicidal ideas, hallucinations, memory loss and substance abuse. The patient is not nervous/anxious and does not have insomnia.        Objective:  Blood pressure 134/70, pulse 83, height 5\' 6"  (1.676 m), weight 184 lb (83.5 kg).   Physical Exam  Vitals reviewed. Constitutional: She is oriented to person, place, and time. She appears well-developed and well-nourished.  HENT:  Head: Normocephalic and atraumatic.        Right Ear: External ear normal.  Left Ear: External ear normal.  Nose: Nose normal.  Mouth/Throat: Oropharynx is clear and moist.  Eyes: Conjunctivae and EOM are normal. Pupils are equal, round, and reactive to light. Right eye exhibits no discharge. Left eye exhibits no discharge. No scleral icterus.  Neck: Normal range of motion. Neck supple. No tracheal deviation present. No thyromegaly present.  Cardiovascular: Normal rate, regular rhythm, normal heart sounds and intact distal pulses.  Exam reveals no gallop and no friction rub.   No murmur heard. Respiratory: Effort normal and breath sounds normal. No respiratory distress. She has no wheezes. She has no rales. She exhibits no tenderness.  GI: Soft. Bowel sounds are normal. She exhibits no distension and no mass. There is no tenderness. There is no rebound and no guarding.  Genitourinary:  Breasts no masses skin changes or nipple changes bilaterally + bioateral implants      Vulva is normal without lesionsVagina is pink moist without discharge Cervix is absnet Uterus is absent Adnexa is negative with normal sized ovaries   Musculoskeletal: Normal range of motion. She exhibits no edema and no tenderness.  Neurological: She is alert and oriented to person, place, and time. She has normal reflexes. She displays  normal reflexes. No cranial nerve deficit. She exhibits normal muscle tone. Coordination normal.  Skin: Skin is warm and dry. No rash noted. No erythema. No pallor.  Psychiatric: She has a normal mood and affect. Her behavior is normal. Judgment and thought content normal.       Medications Ordered at today's visit: Meds ordered this encounter  Medications  . estradiol (ESTRACE) 1 MG tablet    Sig: Take 1 tablet (1 mg total) by mouth daily.    Dispense:  30 tablet    Refill:  11    Other orders placed at today's visit: No orders of the defined types were placed  in this encounter.     Assessment:    Healthy female exam.    Plan:    Hormone replacement therapy: hormone replacement therapy: estradiol 1 mg daily. Mammogram ordered. Follow up in: 1 year.     Return in about 1 year (around 09/20/2018) for yearly, with Dr Despina Hidden.

## 2017-11-23 ENCOUNTER — Other Ambulatory Visit: Payer: Self-pay | Admitting: Obstetrics & Gynecology

## 2018-01-27 ENCOUNTER — Other Ambulatory Visit: Payer: Self-pay | Admitting: Obstetrics & Gynecology

## 2018-02-07 ENCOUNTER — Other Ambulatory Visit: Payer: Self-pay

## 2018-02-07 ENCOUNTER — Emergency Department: Payer: 59

## 2018-02-07 ENCOUNTER — Encounter: Payer: Self-pay | Admitting: Emergency Medicine

## 2018-02-07 ENCOUNTER — Emergency Department
Admission: EM | Admit: 2018-02-07 | Discharge: 2018-02-07 | Disposition: A | Discharge status: Home / Self Care | Payer: 59 | Attending: Emergency Medicine | Admitting: Emergency Medicine

## 2018-02-07 DIAGNOSIS — J189 Pneumonia, unspecified organism: Secondary | ICD-10-CM

## 2018-02-07 DIAGNOSIS — R05 Cough: Secondary | ICD-10-CM | POA: Diagnosis present

## 2018-02-07 DIAGNOSIS — J181 Lobar pneumonia, unspecified organism: Secondary | ICD-10-CM | POA: Diagnosis not present

## 2018-02-07 DIAGNOSIS — Z79899 Other long term (current) drug therapy: Secondary | ICD-10-CM | POA: Diagnosis not present

## 2018-02-07 LAB — CBC WITH DIFFERENTIAL/PLATELET
ABS IMMATURE GRANULOCYTES: 0.09 10*3/uL — AB (ref 0.00–0.07)
BASOS ABS: 0.1 10*3/uL (ref 0.0–0.1)
Basophils Relative: 0 %
Eosinophils Absolute: 0 10*3/uL (ref 0.0–0.5)
Eosinophils Relative: 0 %
HEMATOCRIT: 40.3 % (ref 36.0–46.0)
HEMOGLOBIN: 13.4 g/dL (ref 12.0–15.0)
IMMATURE GRANULOCYTES: 1 %
LYMPHS ABS: 1.3 10*3/uL (ref 0.7–4.0)
LYMPHS PCT: 10 %
MCH: 28.6 pg (ref 26.0–34.0)
MCHC: 33.3 g/dL (ref 30.0–36.0)
MCV: 86.1 fL (ref 80.0–100.0)
Monocytes Absolute: 0.4 10*3/uL (ref 0.1–1.0)
Monocytes Relative: 3 %
NEUTROS ABS: 11.9 10*3/uL — AB (ref 1.7–7.7)
NEUTROS PCT: 86 %
NRBC: 0 % (ref 0.0–0.2)
Platelets: 445 10*3/uL — ABNORMAL HIGH (ref 150–400)
RBC: 4.68 MIL/uL (ref 3.87–5.11)
RDW: 11.6 % (ref 11.5–15.5)
WBC: 13.8 10*3/uL — AB (ref 4.0–10.5)

## 2018-02-07 LAB — COMPREHENSIVE METABOLIC PANEL
ALBUMIN: 4 g/dL (ref 3.5–5.0)
ALK PHOS: 80 U/L (ref 38–126)
ALT: 11 U/L (ref 0–44)
ANION GAP: 13 (ref 5–15)
AST: 17 U/L (ref 15–41)
BUN: 7 mg/dL (ref 6–20)
CALCIUM: 9 mg/dL (ref 8.9–10.3)
CO2: 23 mmol/L (ref 22–32)
Chloride: 101 mmol/L (ref 98–111)
Creatinine, Ser: 0.6 mg/dL (ref 0.44–1.00)
GFR calc Af Amer: >60 mL/min (ref >60)
GFR calc non Af Amer: >60 mL/min (ref >60)
GLUCOSE: 98 mg/dL (ref 70–99)
Potassium: 3.5 mmol/L (ref 3.5–5.1)
Sodium: 137 mmol/L (ref 135–145)
Total Bilirubin: 1 mg/dL (ref 0.3–1.2)
Total Protein: 8.6 g/dL — ABNORMAL HIGH (ref 6.5–8.1)

## 2018-02-07 LAB — GROUP A STREP BY PCR: Group A Strep by PCR: NOT DETECTED

## 2018-02-07 MED ORDER — PREDNISONE 50 MG PO TABS: ORAL_TABLET | ORAL | 0 refills | Status: DC

## 2018-02-07 MED ORDER — AZITHROMYCIN 250 MG PO TABS: ORAL_TABLET | ORAL | 0 refills | Status: DC

## 2018-02-07 MED ORDER — SODIUM CHLORIDE 0.9 % IV SOLN
1.0000 g | Freq: Once | INTRAVENOUS | Status: AC
  Administered 2018-02-07: 1 g via INTRAVENOUS
  Filled 2018-02-07: qty 10

## 2018-02-07 MED ORDER — SODIUM CHLORIDE 0.9 % IV BOLUS
1000.0000 mL | Freq: Once | INTRAVENOUS | Status: AC
  Administered 2018-02-07: 1000 mL via INTRAVENOUS

## 2018-02-07 NOTE — ED Notes (Signed)

## 2018-02-07 NOTE — ED Triage Notes (Signed)
Pt to triage via w/c with no distress noted, mask in place; pt reports prod cough of brown sputum, fever, and sore throat since Sunday; taking OTC nyquil without relief

## 2018-02-07 NOTE — ED Provider Notes (Signed)
[facility] Emergency Department Provider Note  ____________________________________________  Time seen: Approximately 10:16 PM  I have reviewed the triage vital signs and the nursing notes.   HISTORY  Chief Complaint Cough and Sore Throat    HPI Tracey Mcneil is a 42 y.o. female presents to the emergency department with cough productive for brown sputum production, shortness of breath, fatigue and fever.  Patient reports that she has been coughing intermittently for the past month but her symptoms acutely worsened 3 days ago.  Patient has never had community-acquired pneumonia in the past.  She denies chest pain, chest tightness or abdominal pain.  No other alleviating measures have been attempted.   Past Medical History:  Diagnosis Date  . Anxiety   . Degenerative disk disease   . Migraines     There are no active problems to display for this patient.   Past Surgical History:  Procedure Laterality Date  . ABDOMINAL HYSTERECTOMY     bladder tach  . BREAST REDUCTION SURGERY    . BREAST SURGERY     breast implants  . MOUTH SURGERY    . TONSILECTOMY/ADENOIDECTOMY WITH MYRINGOTOMY      Prior to Admission medications   Medication Sig Start Date End Date Taking? Authorizing Provider  ALPRAZolam Prudy Feeler) 1 MG tablet TAKE 1 TABLET BY MOUTH THREE TIMES A DAY AS NEEDED 01/27/18   Lazaro Arms, MD  azithromycin (ZITHROMAX Z-PAK) 250 MG tablet Take 2 the first day. Take 1 on days 2-5. 02/07/18   Pia Mau M, PA-C  estradiol (ESTRACE) 1 MG tablet TAKE 1 TABLET BY MOUTH EVERY DAY 11/23/17   Lazaro Arms, MD  predniSONE (DELTASONE) 50 MG tablet Take one 50 mg tablet once daily for the next five days. 02/07/18   Orvil Feil, PA-C    Allergies Levaquin [levofloxacin]; Quinine derivatives; Sulfa antibiotics; and Sulfa antibiotics  Family History  Problem Relation Age of Onset  . Thyroid disease Mother   . Diabetes Father   . Hyperlipidemia Father    . ALS Father   . Diabetes Sister   . Heart disease Sister   . Thyroid disease Sister   . Heart disease Maternal Grandmother        CHF  . Diabetes Maternal Grandmother   . Heart disease Paternal Grandmother   . Alzheimer's disease Paternal Grandmother   . Heart failure Paternal Grandmother   . Thyroid disease Sister   . Thyroid disease Sister     Social History Social History   Tobacco Use  . Smoking status: Never Smoker  . Smokeless tobacco: Never Used  Substance Use Topics  . Alcohol use: No  . Drug use: No     Review of Systems  Constitutional: Patient has fever.  Eyes: No visual changes. No discharge ENT: No upper respiratory complaints. Cardiovascular: no chest pain. Respiratory: Patient has cough, SOB Gastrointestinal: No abdominal pain.  No nausea, no vomiting.  No diarrhea.  No constipation. Musculoskeletal: Negative for musculoskeletal pain. Skin: Negative for rash, abrasions, lacerations, ecchymosis. Neurological: Negative for headaches, focal weakness or numbness.   ____________________________________________   PHYSICAL EXAM:  VITAL SIGNS: ED Triage Vitals  Enc Vitals Group     BP 02/07/18 1939 122/66     Pulse Rate 02/07/18 1939 (!) 110     Resp 02/07/18 1939 20     Temp 02/07/18 1939 98.6 F (37 C)     Temp Source 02/07/18 1939 Oral     SpO2 02/07/18 1939  95 %     Weight 02/07/18 1941 140 lb (63.5 kg)     Height 02/07/18 1941 5\' 5"  (1.651 m)     Head Circumference --      Peak Flow --      Pain Score 02/07/18 1941 5     Pain Loc --      Pain Edu? --      Excl. in GC? --      Constitutional: Alert and oriented. Well appearing and in no acute distress. Eyes: Conjunctivae are normal. PERRL. EOMI. Head: Atraumatic. ENT:      Ears: TMs are pearly.      Nose: No congestion/rhinnorhea.      Mouth/Throat: Mucous membranes are moist.  Neck: No stridor.  No cervical spine tenderness to palpation. Hematological/Lymphatic/Immunilogical: No  cervical lymphadenopathy. Cardiovascular: Normal rate, regular rhythm. Normal S1 and S2.  Good peripheral circulation. Respiratory: Normal respiratory effort without tachypnea or retractions.  Inspiratory crackles auscultated. Good air entry to the bases with no decreased or absent breath sounds. Gastrointestinal: Bowel sounds 4 quadrants. Soft and nontender to palpation. No guarding or rigidity. No palpable masses. No distention. No CVA tenderness. Musculoskeletal: Full range of motion to all extremities. No gross deformities appreciated. Neurologic:  Normal speech and language. No gross focal neurologic deficits are appreciated.  Skin:  Skin is warm, dry and intact. No rash noted. Psychiatric: Mood and affect are normal. Speech and behavior are normal. Patient exhibits appropriate insight and judgement.   ____________________________________________   LABS (all labs ordered are listed, but only abnormal results are displayed)  Labs Reviewed  CBC WITH DIFFERENTIAL/PLATELET - Abnormal; Notable for the following components:      Result Value   WBC 13.8 (*)    Platelets 445 (*)    Neutro Abs 11.9 (*)    Abs Immature Granulocytes 0.09 (*)    All other components within normal limits  COMPREHENSIVE METABOLIC PANEL - Abnormal; Notable for the following components:   Total Protein 8.6 (*)    All other components within normal limits  GROUP A STREP BY PCR   ____________________________________________  EKG   ____________________________________________  RADIOLOGY I personally viewed and evaluated these images as part of my medical decision making, as well as reviewing the written report by the radiologist.  Dg Chest 2 View  Result Date: 02/07/2018 CLINICAL DATA:  Productive cough.  Fever. EXAM: CHEST - 2 VIEW COMPARISON:  None. FINDINGS: Heart size is normal. Mediastinal shadows are normal. There is bronchial thickening. There are hazy pulmonary infiltrates in the mid and lower  lungs bilaterally. Upper lungs appear clear. No effusions. No acute bone finding. IMPRESSION: Bronchitis pattern. Suspicion of hazy bilateral lower lung pneumonia. No dense consolidation or lobar collapse. Electronically Signed   By: Paulina Fusi M.D.   On: 02/07/2018 20:13    ____________________________________________    PROCEDURES  Procedure(s) performed:    Procedures    Medications  cefTRIAXone (ROCEPHIN) 1 g in sodium chloride 0.9 % 100 mL IVPB (0 g Intravenous Stopped 02/07/18 2129)  sodium chloride 0.9 % bolus 1,000 mL (1,000 mLs Intravenous New Bag/Given 02/07/18 2057)     ____________________________________________   INITIAL IMPRESSION / ASSESSMENT AND PLAN / ED COURSE  Pertinent labs & imaging results that were available during my care of the patient were reviewed by me and considered in my medical decision making (see chart for details).  Review of the [place] CSRS was performed in accordance of the [facility] prior to dispensing any  controlled drugs.    Assessment and plan Community-acquired pneumonia Patient presents to the emergency department with productive cough, shortness of breath and fever.  Chest x-ray shows findings consistent with early consolidation with left lower lobe.  Patient was given Rocephin and fluids in the emergency department.  She was discharged with azithromycin and prednisone.  She was advised to follow-up with primary care as needed.  All patient questions were answered.    ____________________________________________  FINAL CLINICAL IMPRESSION(S) / ED DIAGNOSES  Final diagnoses:  Community acquired pneumonia of left lower lobe of lung (HCC)      NEW MEDICATIONS STARTED DURING THIS VISIT:  ED Discharge Orders         Ordered    azithromycin (ZITHROMAX Z-PAK) 250 MG tablet     02/07/18 2203    predniSONE (DELTASONE) 50 MG tablet     02/07/18 2203              This chart was dictated using voice recognition software/Dragon.  Despite best efforts to proofread, errors can occur which can change the meaning. Any change was purely unintentional.    Orvil Feil, PA-C 02/07/18 2221    Minna Antis, MD 02/07/18 2352

## 2018-03-08 ENCOUNTER — Encounter: Payer: Self-pay | Admitting: Pulmonary Disease

## 2018-03-08 ENCOUNTER — Ambulatory Visit (INDEPENDENT_AMBULATORY_CARE_PROVIDER_SITE_OTHER): Payer: 59 | Admitting: Pulmonary Disease

## 2018-03-08 VITALS — BP 118/78 | HR 74 | Ht 64.0 in | Wt 142.4 lb

## 2018-03-08 DIAGNOSIS — R0602 Shortness of breath: Secondary | ICD-10-CM

## 2018-03-08 DIAGNOSIS — B0229 Other postherpetic nervous system involvement: Secondary | ICD-10-CM | POA: Diagnosis not present

## 2018-03-08 DIAGNOSIS — R109 Unspecified abdominal pain: Secondary | ICD-10-CM

## 2018-03-08 DIAGNOSIS — M792 Neuralgia and neuritis, unspecified: Secondary | ICD-10-CM

## 2018-03-08 MED ORDER — LIDOCAINE 5 % EX PTCH: 1.0000 | MEDICATED_PATCH | CUTANEOUS | 0 refills | Status: DC
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
## 2018-03-08 NOTE — Patient Instructions (Addendum)
Thank you for visiting Dr[facility] T[facility]card at [facility]. Today we recommend the following:  Meds ordered this encounter  Medications  . lidocaine (LIDODERM) 5 %    Sig: Place 1 patch onto the skin daily. Remove & Discard patch within 12 hours or as directed by MD.    Dispense:  30 patch    Refill:  0   Return if symptoms worsen or fail to improve.

## 2018-03-15 ENCOUNTER — Telehealth: Payer: Self-pay | Admitting: Pulmonary Disease

## 2018-03-15 NOTE — Telephone Encounter (Signed)
PA request received from [facility] Drug: Lidocaine 5% patch CMM Key: [id number]  PA request has been sent to plan, and a determination is expected within 5 business days.   Routing to [place] for follow-up.

## 2018-03-17 NOTE — Telephone Encounter (Signed)
Received PA approval for Lidocaine Pads. Call made to pharmacy to make aware. Medication is now available for pick up. My chart message sent to patient to make aware. Nothing further is needed at this time.

## 2018-06-02 ENCOUNTER — Other Ambulatory Visit: Payer: Self-pay | Admitting: *Deleted

## 2018-06-02 MED ORDER — ALPRAZOLAM 1 MG PO TABS: 1.0000 mg | ORAL_TABLET | Freq: Three times a day (TID) | ORAL | 3 refills | Status: DC | PRN

## 2018-10-02 ENCOUNTER — Other Ambulatory Visit: Payer: Self-pay | Admitting: Obstetrics & Gynecology

## 2018-11-08 ENCOUNTER — Telehealth: Payer: Self-pay | Admitting: Obstetrics & Gynecology

## 2018-11-08 MED ORDER — FLUCONAZOLE 150 MG PO TABS: 150.0000 mg | ORAL_TABLET | Freq: Once | ORAL | 0 refills | Status: AC

## 2018-11-08 NOTE — Telephone Encounter (Signed)
Patient called, requesting a Diflucan.  [facility]  [phone]

## 2018-11-08 NOTE — Telephone Encounter (Signed)
Diflucan sent in per Dr. Rip Harbour.

## 2018-12-01 ENCOUNTER — Other Ambulatory Visit: Payer: Self-pay | Admitting: *Deleted

## 2018-12-01 MED ORDER — ESTRADIOL 1 MG PO TABS: 1.0000 mg | ORAL_TABLET | Freq: Every day | ORAL | 4 refills | Status: DC

## 2019-01-30 ENCOUNTER — Other Ambulatory Visit: Payer: Self-pay | Admitting: Obstetrics & Gynecology

## 2019-06-03 ENCOUNTER — Other Ambulatory Visit: Payer: Self-pay | Admitting: Obstetrics & Gynecology

## 2019-08-29 IMAGING — CR DG CHEST 2V
1 series · 2 of 2 positions shown · non-contrast
Comparison: None.

CLINICAL DATA: Productive cough.  Fever.

EXAM:
CHEST - 2 VIEW

[Series 1: dg chest 2 view · 0.14mm/px · 2 of 2 slices shown]
[im 1/2]
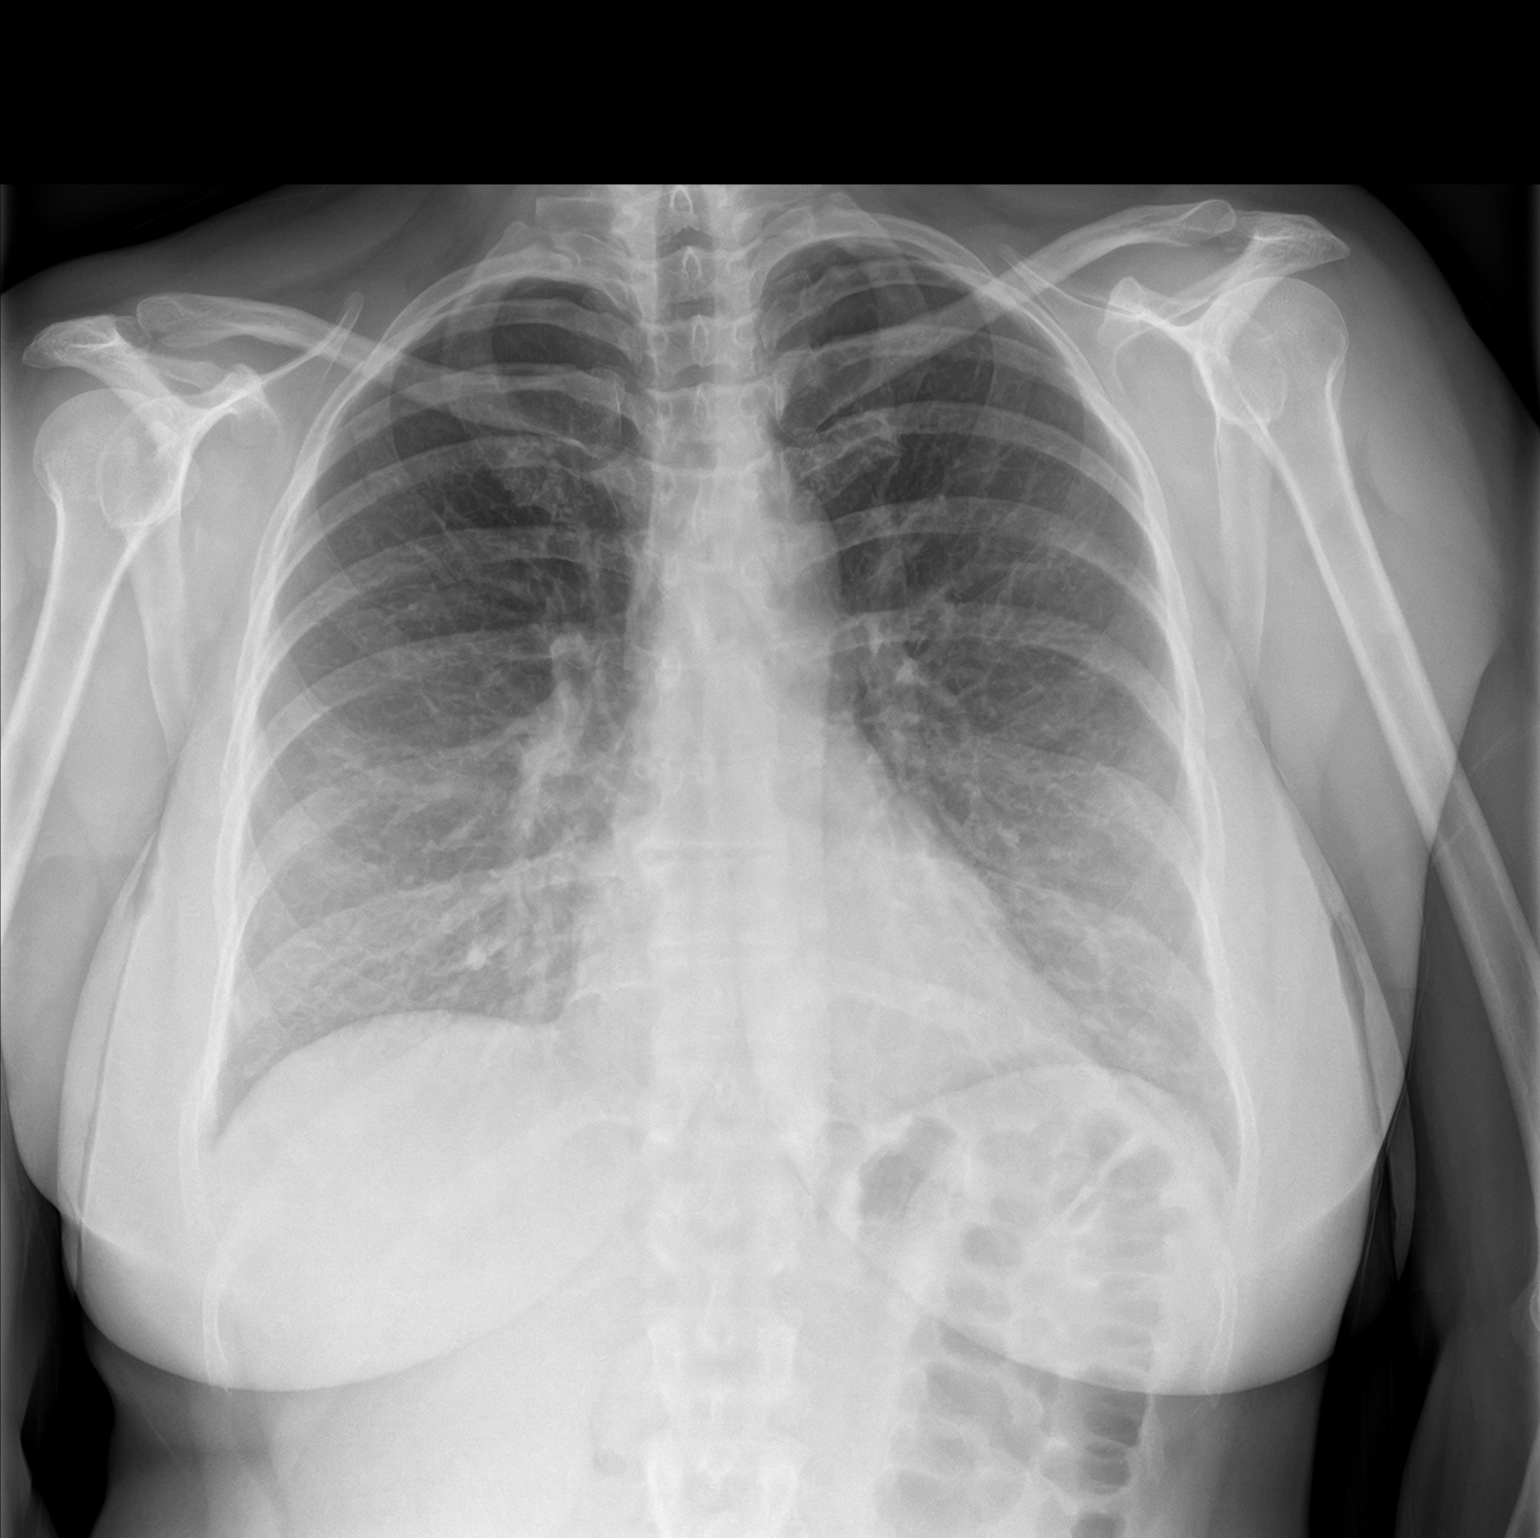
[im 2/2]
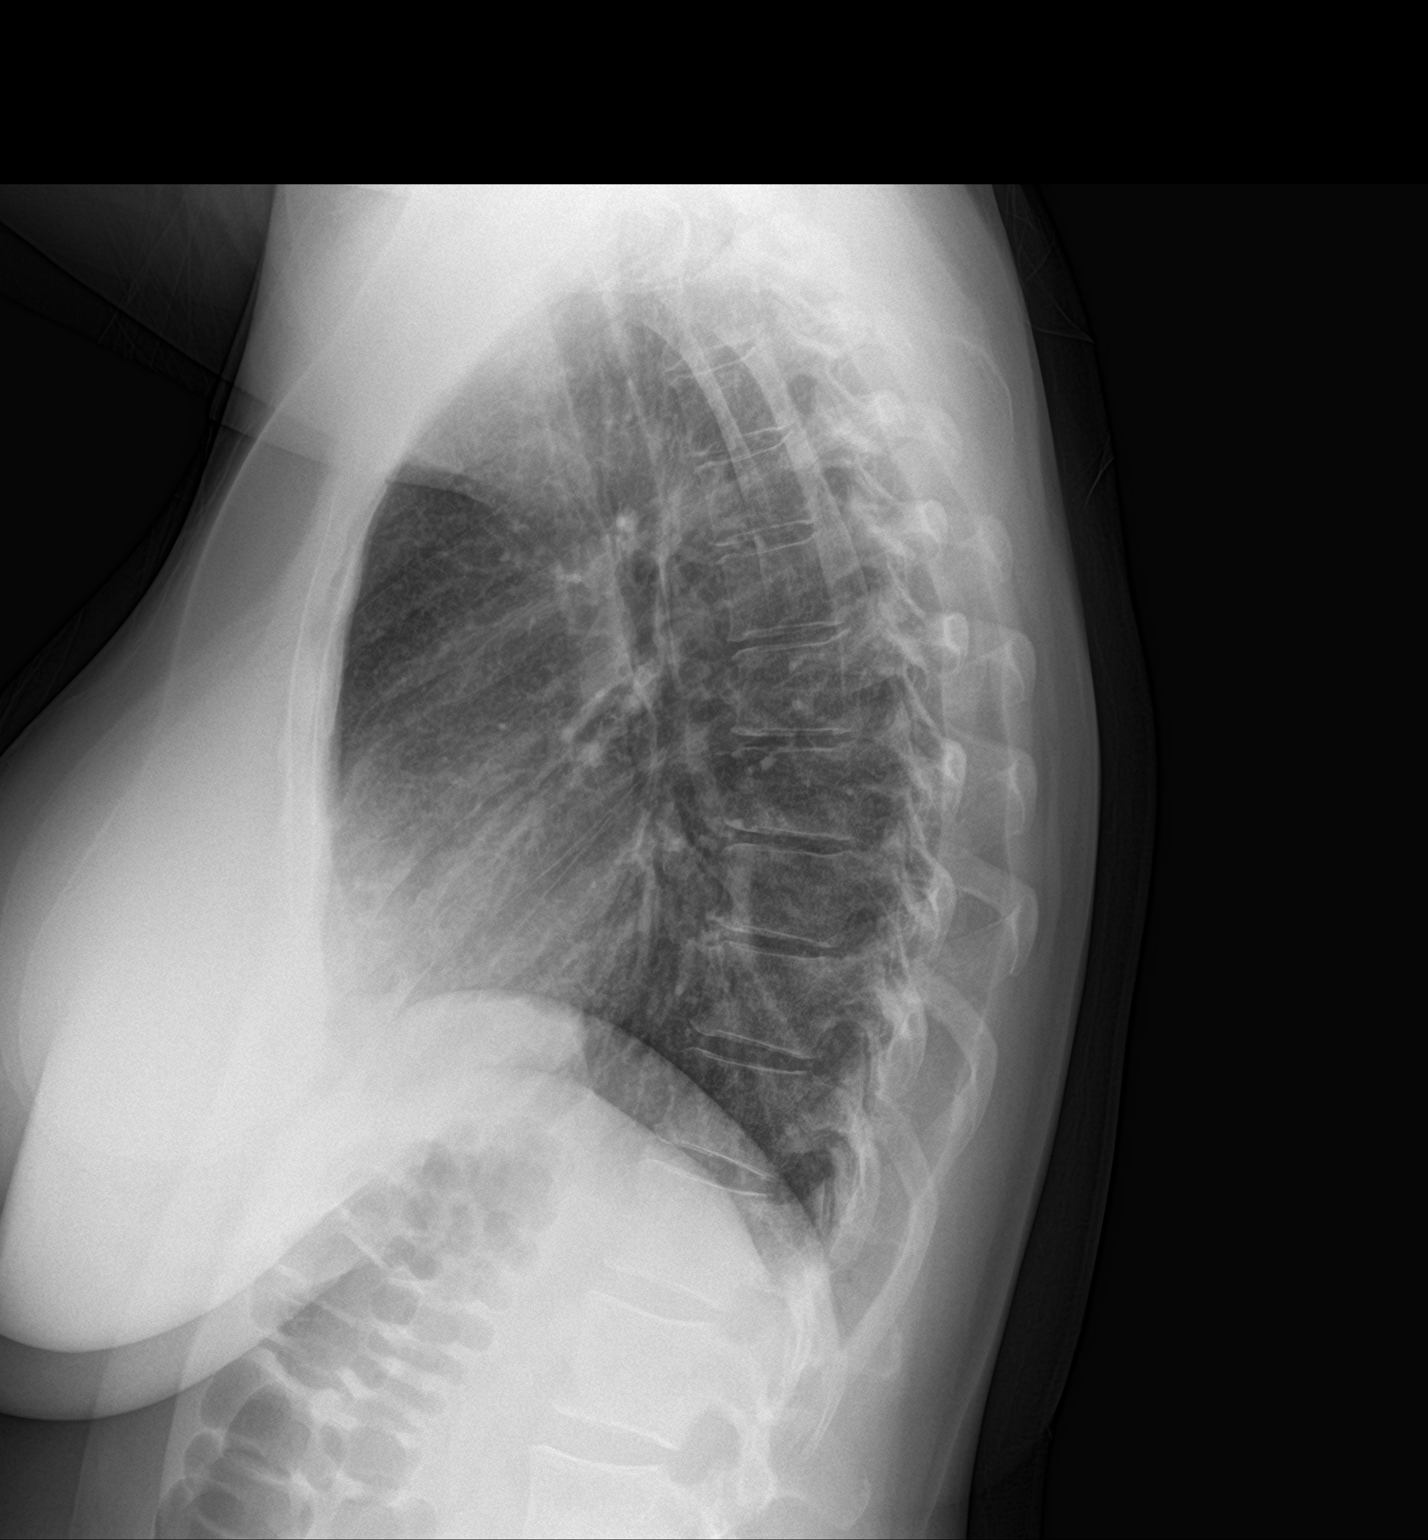

[2 of 2 positions shown; findings below may reference images not displayed]

FINDINGS: Heart size is normal. Mediastinal shadows are normal. There is
bronchial thickening. There are hazy pulmonary infiltrates in the
mid and lower lungs bilaterally. Upper lungs appear clear. No
effusions. No acute bone finding.
IMPRESSION: Bronchitis pattern. Suspicion of hazy bilateral lower lung
pneumonia. No dense consolidation or lobar collapse.

## 2019-10-16 ENCOUNTER — Other Ambulatory Visit: Payer: Self-pay | Admitting: Obstetrics & Gynecology

## 2020-02-19 ENCOUNTER — Other Ambulatory Visit: Payer: Self-pay | Admitting: Obstetrics & Gynecology

## 2020-06-18 ENCOUNTER — Other Ambulatory Visit: Payer: Self-pay | Admitting: Obstetrics & Gynecology

## 2020-06-20 ENCOUNTER — Telehealth: Payer: Self-pay | Admitting: Adult Health

## 2020-10-27 ENCOUNTER — Other Ambulatory Visit: Payer: Self-pay | Admitting: Obstetrics & Gynecology

## 2021-02-19 ENCOUNTER — Other Ambulatory Visit: Payer: Self-pay | Admitting: Obstetrics & Gynecology

## 2021-02-23 ENCOUNTER — Telehealth: Payer: Self-pay | Admitting: Obstetrics & Gynecology

## 2021-02-23 MED ORDER — ALPRAZOLAM 1 MG PO TABS: 1.0000 mg | ORAL_TABLET | Freq: Three times a day (TID) | ORAL | 5 refills | Status: DC | PRN

## 2021-02-23 NOTE — Telephone Encounter (Signed)
Patient sent in for a refill on xanax on November 17th and was wondering what the status was on it.

## 2021-02-23 NOTE — Telephone Encounter (Signed)
Pt aware refill was sent to pharmacy. JSY 

## 2021-08-13 ENCOUNTER — Ambulatory Visit: Payer: 59 | Admitting: Obstetrics & Gynecology

## 2021-08-13 ENCOUNTER — Encounter: Payer: Self-pay | Admitting: Obstetrics & Gynecology

## 2021-08-13 VITALS — BP 113/71 | HR 77 | Ht 64.0 in | Wt 151.0 lb

## 2021-08-13 DIAGNOSIS — R4589 Other symptoms and signs involving emotional state: Secondary | ICD-10-CM

## 2021-08-13 DIAGNOSIS — N951 Menopausal and female climacteric states: Secondary | ICD-10-CM | POA: Diagnosis not present

## 2021-08-13 NOTE — Progress Notes (Signed)
? ? ? ? ?Chief Complaint  ?Patient presents with  ? Menopause  ? ? ? ? ?46 y.o. [id number] No LMP recorded. Patient has had a hysterectomy. The current method of family planning is status post hysterectomy. ? ?Outpatient Encounter Medications as of 08/13/2021  ?Medication Sig  ? ALPRAZolam (XANAX) 1 MG tablet Take 1 tablet (1 mg total) by mouth 3 (three) times daily as needed.  ? estradiol (ESTRACE) 1 MG tablet Take 1 tablet (1 mg total) by mouth daily. (Patient not taking: Reported on 08/13/2021)  ? [DISCONTINUED] ALPRAZolam (XANAX) 1 MG tablet TAKE 1 TABLET BY MOUTH THREE TIMES A DAY AS NEEDED  ? [DISCONTINUED] lidocaine (LIDODERM) 5 % Place 1 patch onto the skin daily. Remove & Discard patch within 12 hours or as directed by MD.  ? ?No facility-administered encounter medications on file as of 08/13/2021.  ? ? ?Subjective ?Few years of typical menoapuse symptoms: hot flaushes/night sweats/emotional lability ?Vasomotor symptoms improve but ?Crying speels happen on E2 supplement ?Difficult to reconciel the two repsonses ?Past Medical History:  ?Diagnosis Date  ? Anxiety   ? Degenerative disk disease   ? Migraines   ? ? ?Past Surgical History:  ?Procedure Laterality Date  ? ABDOMINAL HYSTERECTOMY    ? bladder tach  ? BREAST REDUCTION SURGERY    ? BREAST SURGERY    ? breast implants  ? MOUTH SURGERY    ? TONSILECTOMY/ADENOIDECTOMY WITH MYRINGOTOMY    ? ? ?OB History   ? ? Gravida  ?3  ? Para  ?3  ? Term  ?3  ? Preterm  ?   ? AB  ?   ? Living  ?3  ?  ? ? SAB  ?   ? IAB  ?   ? Ectopic  ?   ? Multiple  ?   ? Live Births  ?3  ?   ?  ?  ? ? ?Allergies  ?Allergen Reactions  ? Levaquin [Levofloxacin] Hives and Swelling  ? Quinine Derivatives Hives and Swelling  ? Sulfa Antibiotics   ? Sulfa Antibiotics Hives and Swelling  ? ? ?Social History  ? ?Socioeconomic History  ? Marital status: Married  ?  Spouse name: Not on file  ? Number of children: Not on file  ? Years of education: Not on file  ? Highest education level: Not on file   ?Occupational History  ? Not on file  ?Tobacco Use  ? Smoking status: Never  ? Smokeless tobacco: Never  ?Vaping Use  ? Vaping Use: Never used  ?Substance and Sexual Activity  ? Alcohol use: No  ? Drug use: No  ? Sexual activity: Yes  ?  Birth control/protection: Surgical  ?  Comment: Hyst  ?Other Topics Concern  ? Not on file  ?Social History Narrative  ? Not on file  ? ?Social Determinants of Health  ? ?Financial Resource Strain: Not on file  ?Food Insecurity: Not on file  ?Transportation Needs: Not on file  ?Physical Activity: Not on file  ?Stress: Not on file  ?Social Connections: Not on file  ? ? ?Family History  ?Problem Relation Age of Onset  ? Thyroid disease Mother   ? Diabetes Father   ? Hyperlipidemia Father   ? ALS Father   ? Diabetes Sister   ? Heart disease Sister   ? Thyroid disease Sister   ? Heart disease Maternal Grandmother   ?     CHF  ? Diabetes Maternal Grandmother   ?  Heart disease Paternal Grandmother   ? Alzheimer's disease Paternal Grandmother   ? Heart failure Paternal Grandmother   ? Thyroid disease Sister   ? Thyroid disease Sister   ? ? ?Medications:       ?Current Outpatient Medications:  ?  ALPRAZolam (XANAX) 1 MG tablet, Take 1 tablet (1 mg total) by mouth 3 (three) times daily as needed., Disp: 90 tablet, Rfl: 5 ?  estradiol (ESTRACE) 1 MG tablet, Take 1 tablet (1 mg total) by mouth daily. (Patient not taking: Reported on 08/13/2021), Disp: 90 tablet, Rfl: 4 ? ?Objective ?Blood pressure 113/71, pulse 77, height 5\' 4"  (1.626 m), weight 151 lb (68.5 kg). ? ?Gen WDWN NAD ? ?Pertinent ROS ?No burning with urination, frequency or urgency ?No nausea, vomiting or diarrhea ?Nor fever chills or other constitutional symptoms ? ? ?Labs or studies ? ? ? ? ?Impression + Management Plan: ?Diagnoses this Encounter:: ?  ICD-10-CM   ?1. Vasomotor symptoms due to menopause  N95.1   ? Complex of symptoms consistent with ovarian failure but has crying spells with estrogen supplement.  Will trial  nextstellis-->menopausal dosing is 15 mg E4  ?  ?2. Crying spells: ?depression vs menopause: TBD  R45.89   ?  ? ? ? ? ?Medications prescribed during  this encounter: ?No orders of the defined types were placed in this encounter. ? ? ?Labs or Scans Ordered during this encounter: ?No orders of the defined types were placed in this encounter. ? ? ? ? ?Follow up ?Return if symptoms worsen or fail to improve, for message response on MyChart is encouraged. ? ? ? ? ? ? ?

## 2021-08-28 ENCOUNTER — Telehealth: Payer: Self-pay | Admitting: Obstetrics & Gynecology

## 2021-08-28 NOTE — Telephone Encounter (Signed)
Patient called stating that Dr. Despina Hidden placed on some hormone therapy medication that is birth control, patient states should she take the last 7 brown sugar pills as well. Please contact pt

## 2021-09-01 NOTE — Telephone Encounter (Signed)
She cn do either, they are placebos only but some folks like it to stay on track

## 2021-09-10 ENCOUNTER — Other Ambulatory Visit: Payer: Self-pay | Admitting: Obstetrics & Gynecology

## 2021-11-11 ENCOUNTER — Encounter: Payer: Self-pay | Admitting: Obstetrics & Gynecology

## 2022-01-23 ENCOUNTER — Other Ambulatory Visit: Payer: Self-pay | Admitting: Obstetrics & Gynecology

## 2022-08-24 ENCOUNTER — Other Ambulatory Visit: Payer: Self-pay | Admitting: Obstetrics & Gynecology

## 2022-09-16 ENCOUNTER — Ambulatory Visit (INDEPENDENT_AMBULATORY_CARE_PROVIDER_SITE_OTHER): Payer: 59 | Admitting: Obstetrics & Gynecology

## 2022-09-16 ENCOUNTER — Encounter: Payer: Self-pay | Admitting: Obstetrics & Gynecology

## 2022-09-16 VITALS — BP 106/74 | HR 77 | Ht 64.0 in | Wt 125.0 lb

## 2022-09-16 DIAGNOSIS — Z01419 Encounter for gynecological examination (general) (routine) without abnormal findings: Secondary | ICD-10-CM

## 2022-09-16 DIAGNOSIS — Z79890 Hormone replacement therapy (postmenopausal): Secondary | ICD-10-CM

## 2022-09-16 MED ORDER — ESTRADIOL 2 MG PO TABS: 2.0000 mg | ORAL_TABLET | Freq: Every day | ORAL | 11 refills | Status: DC

## 2022-09-16 NOTE — Progress Notes (Signed)
Subjective:     Tracey Mcneil is a 47 y.o. female here for a routine exam.  No LMP recorded. Patient has had a hysterectomy. [id number] Birth Control Method:  hysterectomy Menstrual Calendar(currently): amenorrheic  Current complaints: none.   Current acute medical issues:  none   Recent Gynecologic History No LMP recorded. Patient has had a hysterectomy. Last Pap: years ago,   Last mammogram: 2016-->had an implant rupture after compression views, recommended breast thermography,    Past Medical History:  Diagnosis Date   Anxiety    Degenerative disk disease    Migraines     Past Surgical History:  Procedure Laterality Date   ABDOMINAL HYSTERECTOMY     bladder tach   BREAST REDUCTION SURGERY     BREAST SURGERY     breast implants   MOUTH SURGERY     TONSILECTOMY/ADENOIDECTOMY WITH MYRINGOTOMY      OB History     Gravida  3   Para  3   Term  3   Preterm      AB      Living  3      SAB      IAB      Ectopic      Multiple      Live Births  3           Social History   Socioeconomic History   Marital status: Married    Spouse name: Not on file   Number of children: Not on file   Years of education: Not on file   Highest education level: Not on file  Occupational History   Not on file  Tobacco Use   Smoking status: Never   Smokeless tobacco: Never  Vaping Use   Vaping Use: Never used  Substance and Sexual Activity   Alcohol use: No   Drug use: No   Sexual activity: Yes    Birth control/protection: Surgical    Comment: Hyst  Other Topics Concern   Not on file  Social History Narrative   Not on file   Social Determinants of Health   Financial Resource Strain: Not on file  Food Insecurity: Not on file  Transportation Needs: Not on file  Physical Activity: Not on file  Stress: Not on file  Social Connections: Not on file    Family History  Problem Relation Age of Onset   Thyroid disease Mother    Diabetes Father     Hyperlipidemia Father    ALS Father    Diabetes Sister    Heart disease Sister    Thyroid disease Sister    Heart disease Maternal Grandmother        CHF   Diabetes Maternal Grandmother    Heart disease Paternal Grandmother    Alzheimer's disease Paternal Grandmother    Heart failure Paternal Grandmother    Thyroid disease Sister    Thyroid disease Sister      Current Outpatient Medications:    ALPRAZolam (XANAX) 1 MG tablet, TAKE 1 TABLET BY MOUTH THREE TIMES A DAY AS NEEDED, Disp: 90 tablet, Rfl: 3   estradiol (ESTRACE) 2 MG tablet, Take 1 tablet (2 mg total) by mouth daily., Disp: 30 tablet, Rfl: 11  Review of Systems  Review of Systems  Constitutional: Negative for fever, chills, weight loss, malaise/fatigue and diaphoresis.  HENT: Negative for hearing loss, ear pain, nosebleeds, congestion, sore throat, neck pain, tinnitus and ear discharge.   Eyes: Negative for blurred vision, double vision, photophobia,  pain, discharge and redness.  Respiratory: Negative for cough, hemoptysis, sputum production, shortness of breath, wheezing and stridor.   Cardiovascular: Negative for chest pain, palpitations, orthopnea, claudication, leg swelling and PND.  Gastrointestinal: negative for abdominal pain. Negative for heartburn, nausea, vomiting, diarrhea, constipation, blood in stool and melena.  Genitourinary: Negative for dysuria, urgency, frequency, hematuria and flank pain.  Musculoskeletal: Negative for myalgias, back pain, joint pain and falls.  Skin: Negative for itching and rash.  Neurological: Negative for dizziness, tingling, tremors, sensory change, speech change, focal weakness, seizures, loss of consciousness, weakness and headaches.  Endo/Heme/Allergies: Negative for environmental allergies and polydipsia. Does not bruise/bleed easily.  Psychiatric/Behavioral: Negative for depression, suicidal ideas, hallucinations, memory loss and substance abuse. The patient is not  nervous/anxious and does not have insomnia.        Objective:  Blood pressure 106/74, pulse 77, height 5\' 4"  (1.626 m), weight 125 lb (56.7 kg).   Physical Exam  Vitals reviewed. Constitutional: She is oriented to person, place, and time. She appears well-developed and well-nourished.  HENT:  Head: Normocephalic and atraumatic.        Right Ear: External ear normal.  Left Ear: External ear normal.  Nose: Nose normal.  Mouth/Throat: Oropharynx is clear and moist.  Eyes: Conjunctivae and EOM are normal. Pupils are equal, round, and reactive to light. Right eye exhibits no discharge. Left eye exhibits no discharge. No scleral icterus.  Neck: Normal range of motion. Neck supple. No tracheal deviation present. No thyromegaly present.  Cardiovascular: Normal rate, regular rhythm, normal heart sounds and intact distal pulses.  Exam reveals no gallop and no friction rub.   No murmur heard. Respiratory: Effort normal and breath sounds normal. No respiratory distress. She has no wheezes. She has no rales. She exhibits no tenderness.  GI: Soft. Bowel sounds are normal. She exhibits no distension and no mass. There is no tenderness. There is no rebound and no guarding.  Genitourinary:  Breasts no masses skin changes or nipple changes bilaterally      Vulva is normal without lesions Vagina is pink moist without discharge cuff apex and bladder well supported Cervix absent Uterus is normal size shape and contour Adnexa is negative with normal sized ovaries   Musculoskeletal: Normal range of motion. She exhibits no edema and no tenderness.  Neurological: She is alert and oriented to person, place, and time. She has normal reflexes. She displays normal reflexes. No cranial nerve deficit. She exhibits normal muscle tone. Coordination normal.  Skin: Skin is warm and dry. No rash noted. No erythema. No pallor.  Psychiatric: She has a normal mood and affect. Her behavior is normal. Judgment and thought  content normal.       Medications Ordered at today's visit: Meds ordered this encounter  Medications   estradiol (ESTRACE) 2 MG tablet    Sig: Take 1 tablet (2 mg total) by mouth daily.    Dispense:  30 tablet    Refill:  11    Other orders placed at today's visit: No orders of the defined types were placed in this encounter.     Assessment:    Normal Gyn exam.   ERT:  estradiol 2 mg daily Plan:    Hormone replacement therapy: estradiol 2 mg daily. Follow up in: 3 years. Breast thermography is recommended      Return in about 3 years (around 09/15/2025) for yearly.

## 2022-10-08 ENCOUNTER — Other Ambulatory Visit: Payer: Self-pay | Admitting: Obstetrics & Gynecology

## 2023-03-07 ENCOUNTER — Other Ambulatory Visit: Payer: Self-pay | Admitting: Obstetrics & Gynecology

## 2023-09-10 ENCOUNTER — Other Ambulatory Visit: Payer: Self-pay | Admitting: Obstetrics & Gynecology

## 2023-11-12 ENCOUNTER — Other Ambulatory Visit: Payer: Self-pay | Admitting: Obstetrics & Gynecology

## 2024-01-27 ENCOUNTER — Other Ambulatory Visit: Payer: Self-pay | Admitting: Obstetrics & Gynecology

## 2024-02-06 ENCOUNTER — Other Ambulatory Visit: Payer: Self-pay | Admitting: Obstetrics & Gynecology

## 2024-02-06 ENCOUNTER — Telehealth: Payer: Self-pay

## 2024-02-06 NOTE — Telephone Encounter (Signed)
 RN called patient to update on status of prescription. Message sent to Dr Jayne to send paper script to pharmacy.

## 2024-02-07 ENCOUNTER — Other Ambulatory Visit (HOSPITAL_COMMUNITY): Payer: Self-pay | Admitting: Obstetrics & Gynecology

## 2024-02-07 MED ORDER — ALPRAZOLAM 1 MG PO TABS: 1.0000 mg | ORAL_TABLET | Freq: Three times a day (TID) | ORAL | 3 refills | Status: DC | PRN

## 2024-03-13 ENCOUNTER — Other Ambulatory Visit: Payer: Self-pay | Admitting: Obstetrics & Gynecology
